# Patient Record
Sex: Female | Born: 2000 | Race: White | Hispanic: No | Marital: Single | Smoking: Never smoker
Health system: Southern US, Community
[De-identification: ages and names within clinical notes are randomized; demographics above are authoritative.]

## PROBLEM LIST (undated history)

## (undated) ENCOUNTER — Inpatient Hospital Stay (HOSPITAL_COMMUNITY): Payer: Self-pay

## (undated) DIAGNOSIS — Z789 Other specified health status: Secondary | ICD-10-CM

## (undated) HISTORY — PX: CLEFT PALATE REPAIR: SUR1165

## (undated) HISTORY — DX: Other specified health status: Z78.9

---
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
## 2018-05-26 DIAGNOSIS — Q379 Unspecified cleft palate with unilateral cleft lip: Secondary | ICD-10-CM | POA: Insufficient documentation

## 2018-05-26 HISTORY — DX: Unspecified cleft palate with unilateral cleft lip: Q37.9

## 2018-10-10 IMAGING — DX DG ANKLE COMPLETE 3+V*R*
3 series · 3 of 3 positions shown · non-contrast
Comparison: None.

CLINICAL DATA: Fall down stairs.  Ankle pain.

EXAM:
RIGHT ANKLE - COMPLETE 3+ VIEW

[ankle ap]
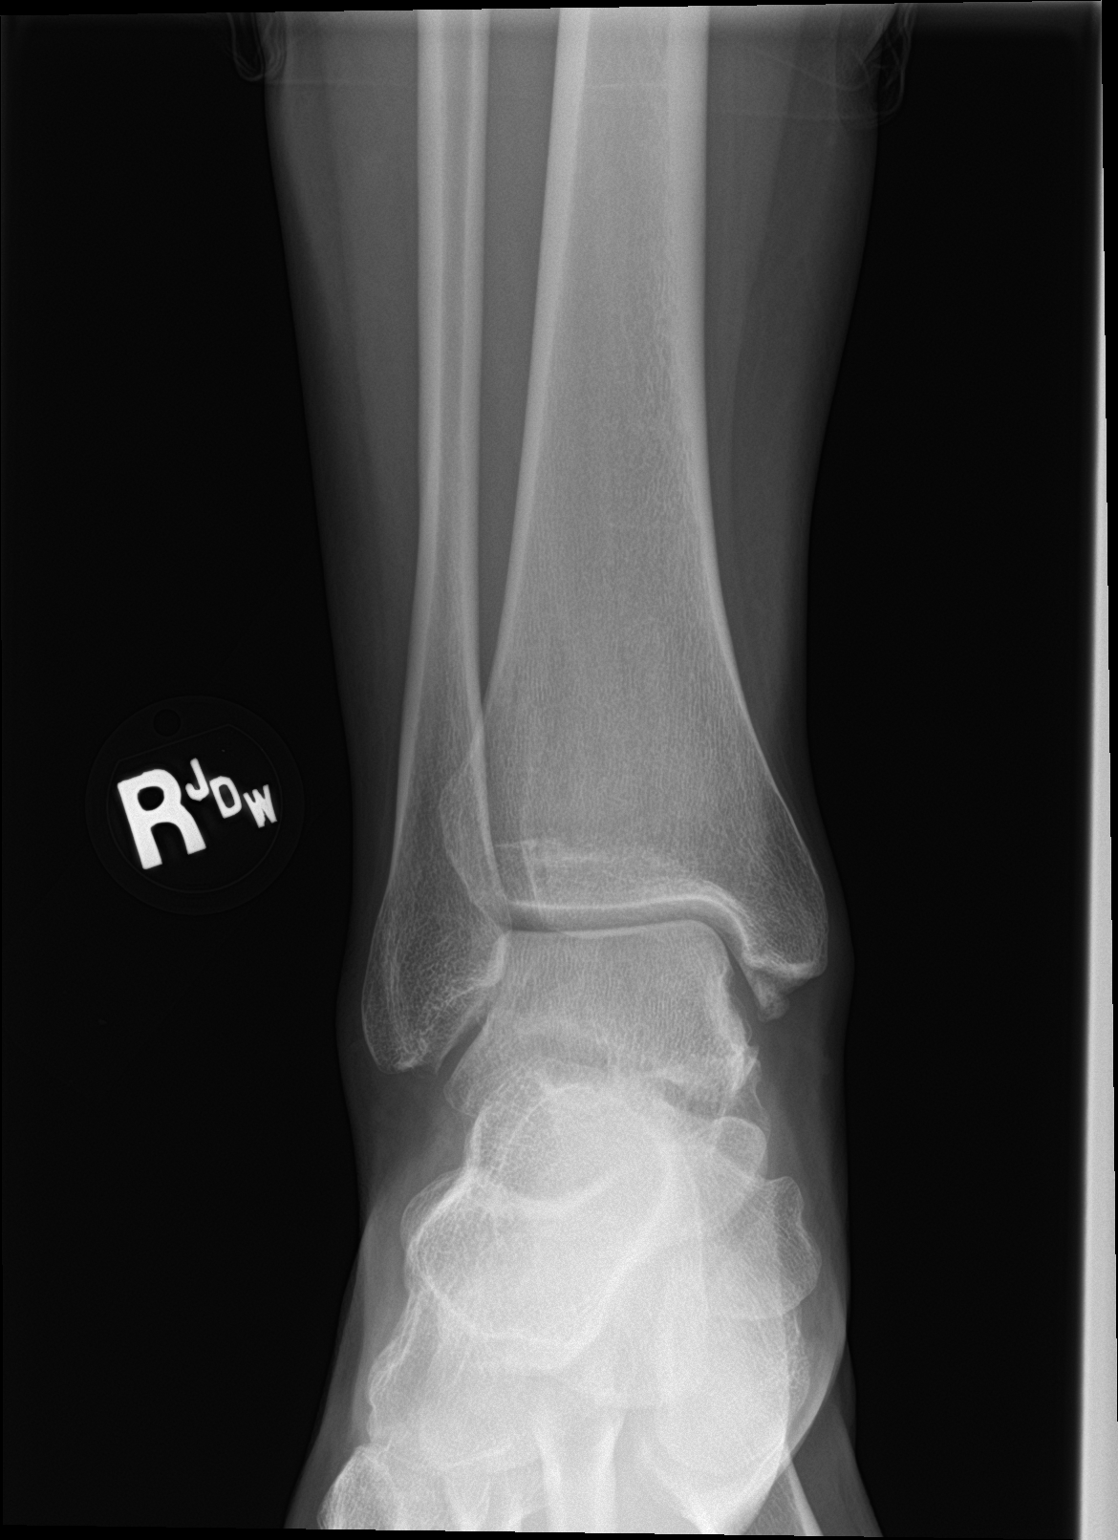

[ankle obl]
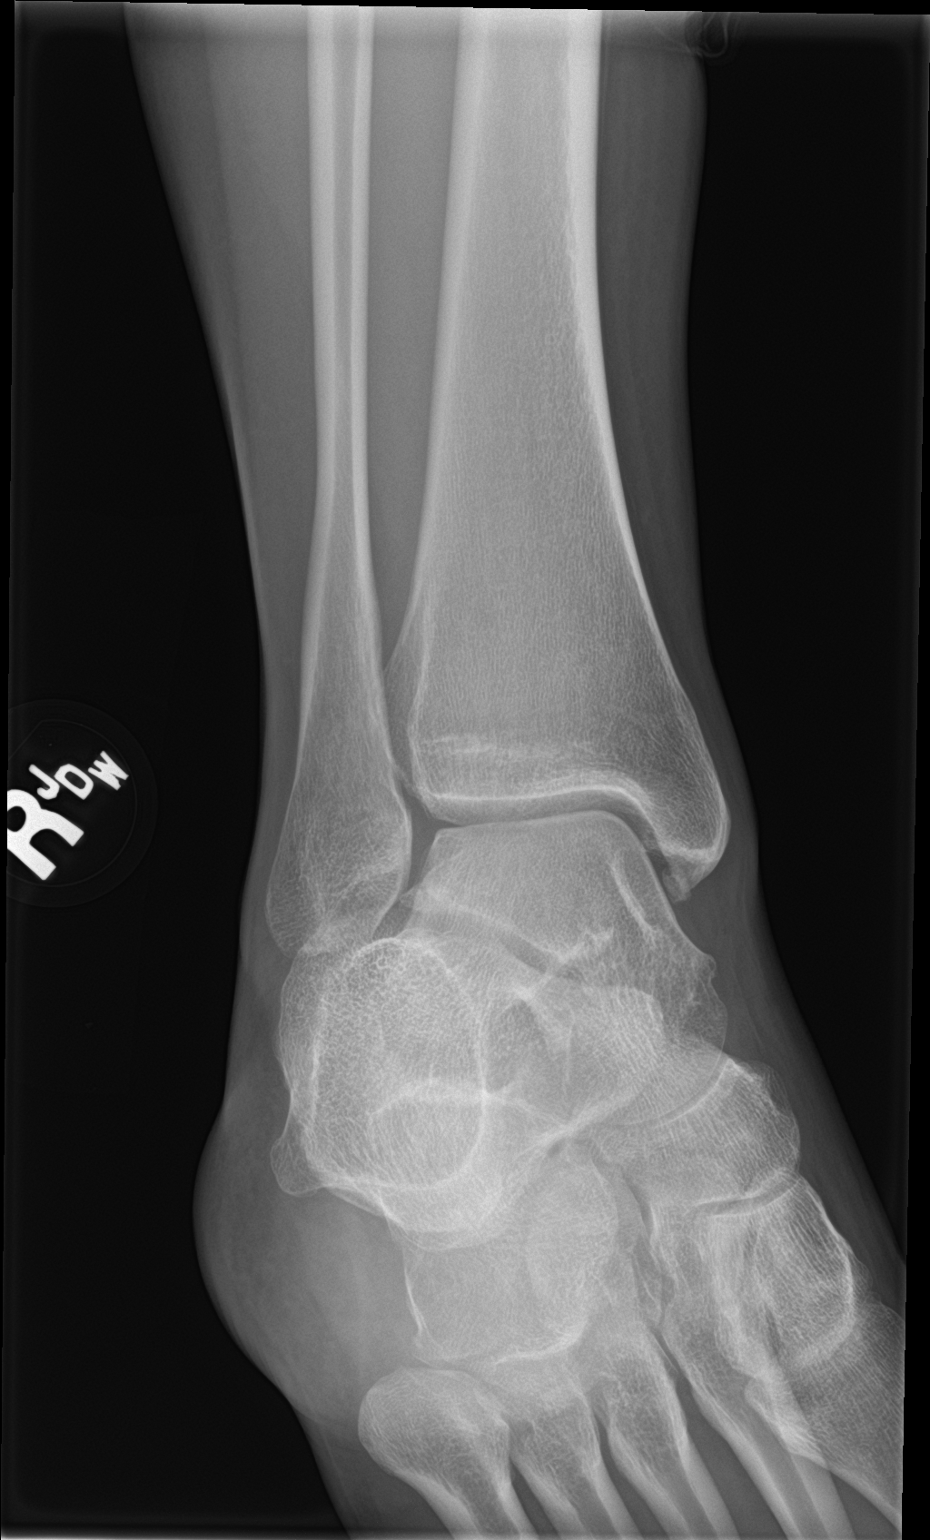

[ankle lat]
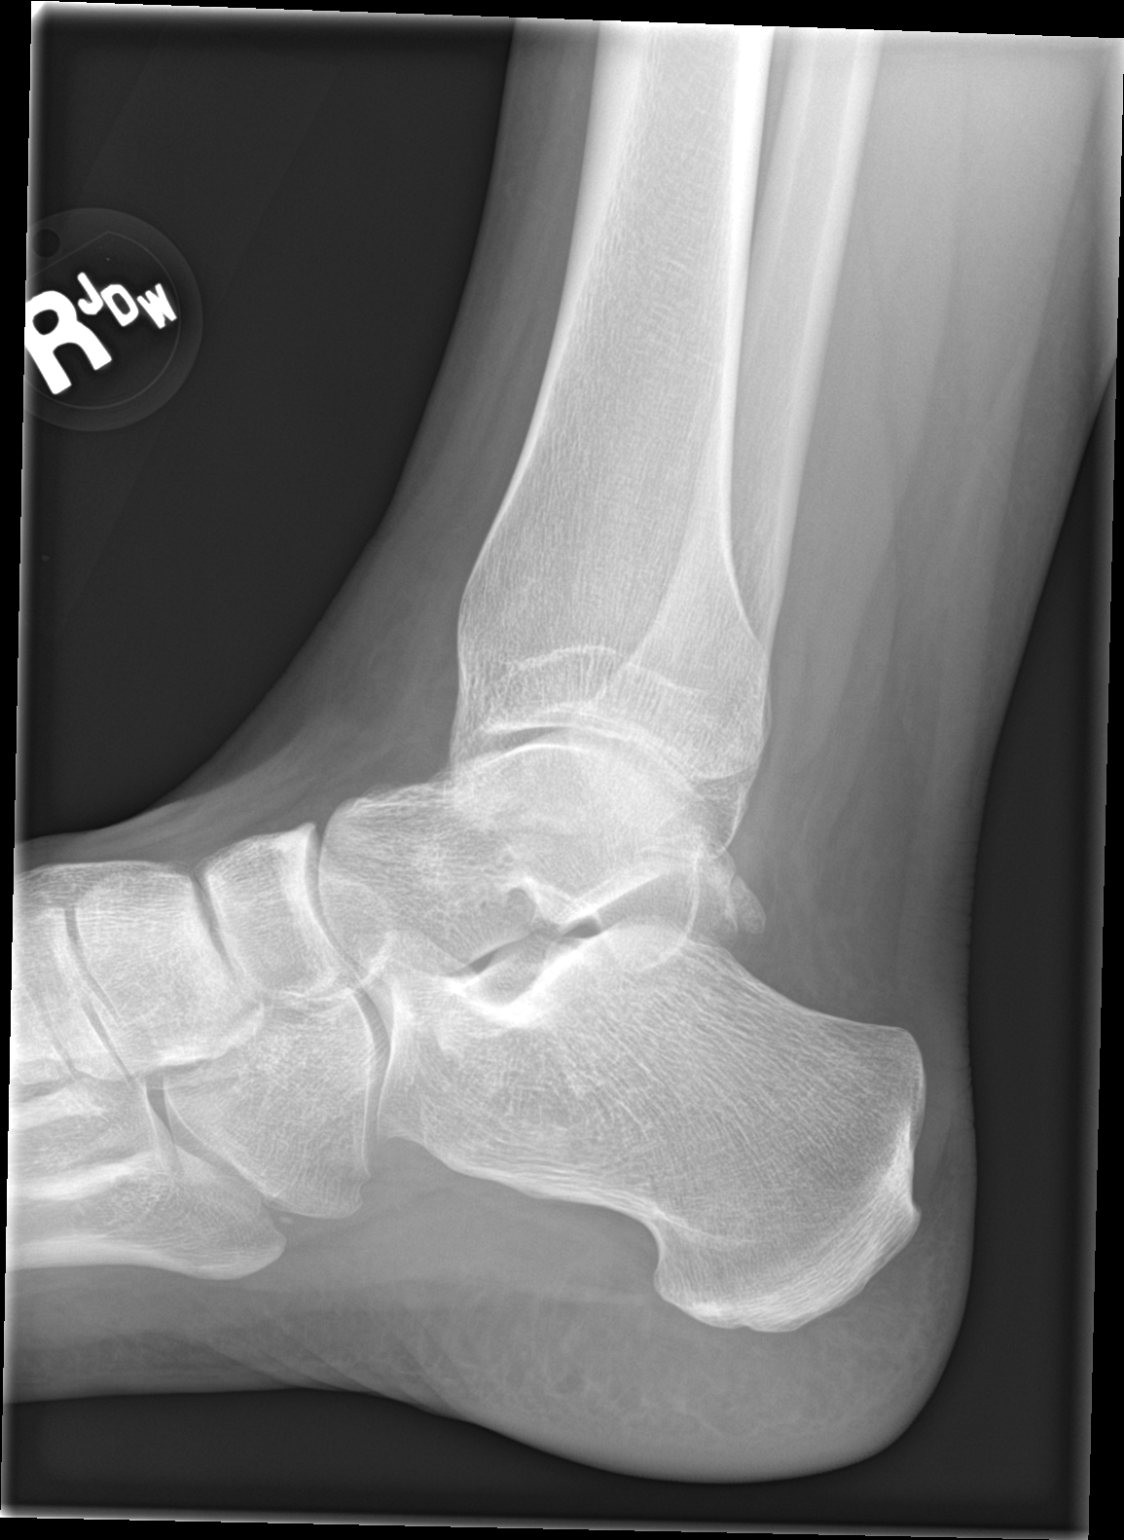

[3 of 3 positions shown; findings below may reference images not displayed]

FINDINGS: Small osseous fragment at the inferior aspect of the medial
malleolus appears corticated. No donor site is identified. There is
mild soft tissue swelling. No other evidence of fracture or
dislocation of the right ankle.
IMPRESSION: Small osseous fragment at the inferior aspect of the medial
malleolus is favored to be an accessory ossicle or a sequela of
prior trauma. An acute avulsion fracture is less likely.

## 2020-11-01 ENCOUNTER — Emergency Department (HOSPITAL_COMMUNITY)
Admission: EM | Admit: 2020-11-01 | Discharge: 2020-11-01 | Disposition: A | Discharge status: Home / Self Care | Payer: Medicaid Other | Attending: Student | Admitting: Student

## 2020-11-01 ENCOUNTER — Other Ambulatory Visit: Payer: Self-pay

## 2020-11-01 ENCOUNTER — Encounter (HOSPITAL_COMMUNITY): Payer: Self-pay | Admitting: Emergency Medicine

## 2020-11-01 DIAGNOSIS — R059 Cough, unspecified: Secondary | ICD-10-CM | POA: Diagnosis present

## 2020-11-01 DIAGNOSIS — Z20822 Contact with and (suspected) exposure to covid-19: Secondary | ICD-10-CM | POA: Diagnosis not present

## 2020-11-01 DIAGNOSIS — R439 Unspecified disturbances of smell and taste: Secondary | ICD-10-CM | POA: Diagnosis not present

## 2020-11-01 DIAGNOSIS — Z5321 Procedure and treatment not carried out due to patient leaving prior to being seen by health care provider: Secondary | ICD-10-CM | POA: Diagnosis not present

## 2020-11-01 DIAGNOSIS — J029 Acute pharyngitis, unspecified: Secondary | ICD-10-CM | POA: Insufficient documentation

## 2020-11-01 NOTE — ED Notes (Signed)
Pt left. 
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
## 2022-12-04 ENCOUNTER — Encounter: Payer: Self-pay | Admitting: Obstetrics and Gynecology

## 2022-12-04 ENCOUNTER — Ambulatory Visit (INDEPENDENT_AMBULATORY_CARE_PROVIDER_SITE_OTHER): Payer: Medicaid Other | Admitting: Obstetrics and Gynecology

## 2022-12-04 VITALS — BP 116/82 | HR 89 | Ht 67.0 in | Wt 182.0 lb

## 2022-12-04 DIAGNOSIS — Z3046 Encounter for surveillance of implantable subdermal contraceptive: Secondary | ICD-10-CM

## 2022-12-04 NOTE — Progress Notes (Signed)
     GYNECOLOGY CLINIC PROCEDURE NOTE  Jennifer Marshall is a 22 y.o. G1P1001 here for Nexplanon removal.   Nexplanon Removal Patient identified, informed consent performed, consent signed.   Appropriate time out taken. Nexplanon site identified.  Area prepped in usual sterile fashon. One ml of 1% lidocaine was used to anesthetize the area at the distal end of the implant. A small stab incision was made right beside the implant on the distal portion.  The Nexplanon rod was grasped using hemostats and removed without difficulty.  There was minimal blood loss. There were no complications.  3 ml of 1% lidocaine was injected around the incision for post-procedure analgesia.  Steri-strips were applied over the small incision.  A pressure bandage was applied to reduce any bruising.  The patient tolerated the procedure well and was given post procedure instructions.  Patient is planning to attempt conception.  She will return in 2-4 weeks for yearly GYN exam.  Arlina Robes, MD, [facility] Attending [place] for [facility], [place]

## 2022-12-09 ENCOUNTER — Encounter: Payer: Self-pay | Admitting: Obstetrics and Gynecology

## 2022-12-18 ENCOUNTER — Ambulatory Visit: Payer: Medicaid Other | Admitting: Obstetrics and Gynecology

## 2023-01-09 ENCOUNTER — Ambulatory Visit: Payer: Medicaid Other | Admitting: Obstetrics & Gynecology

## 2023-01-09 ENCOUNTER — Ambulatory Visit: Payer: Medicaid Other | Admitting: Advanced Practice Midwife

## 2023-10-21 NOTE — L&D Delivery Note (Signed)
 Labor Progress Jennifer  Lanice Marshall is a 23 y.o. female [phone] with IUP at [redacted]w[redacted]d admitted for elective IOL. She progressed with augmentation to complete and pushed less than 20 minutes to deliver.  Cord clamping delayed by 1-3 minutes then clamped by CNM and cut by pt family member.  Placenta intact and spontaneous, bleeding minimal.  Intact perineum, no repair.  Mom and baby stable prior to transfer to postpartum. She plans on breastfeeding. She requests Depo Provera for birth control.   Delivery Note At 8:29 PM a viable and healthy female was delivered via Vaginal, Spontaneous (Presentation: Right Occiput Anterior).  APGAR: 7, 9; weight 7 lb 14 oz (3572 g).   Placenta status: Pathology, Intact.  Cord: 3 vessels with the following complications: None.   Anesthesia: Epidural Episiotomy: None Lacerations: Labial, superficial, hemostatic Suture Repair: n/a Est. Blood Loss (mL): 311  Mom to postpartum.  Baby to Couplet care / Skin to Skin.  [name] 08/10/2024, 7:08 AM

## 2023-11-07 DIAGNOSIS — R399 Unspecified symptoms and signs involving the genitourinary system: Secondary | ICD-10-CM

## 2023-11-07 NOTE — Progress Notes (Signed)
Ms orren signed on and off at several different times outside of her scheduled appointment times. I was unable to obtain a video visit with her

## 2023-12-08 ENCOUNTER — Other Ambulatory Visit: Payer: Medicaid Other

## 2023-12-09 ENCOUNTER — Ambulatory Visit
Admission: EM | Admit: 2023-12-09 | Discharge: 2023-12-09 | Disposition: A | Discharge status: Home / Self Care | Payer: Medicaid Other | Attending: Nurse Practitioner | Admitting: Nurse Practitioner

## 2023-12-09 DIAGNOSIS — R399 Unspecified symptoms and signs involving the genitourinary system: Secondary | ICD-10-CM | POA: Insufficient documentation

## 2023-12-09 DIAGNOSIS — Z3201 Encounter for pregnancy test, result positive: Secondary | ICD-10-CM | POA: Diagnosis present

## 2023-12-09 LAB — POCT URINE PREGNANCY: Preg Test, Ur: POSITIVE — AB

## 2023-12-09 LAB — POCT URINALYSIS DIP (MANUAL ENTRY)
Bilirubin, UA: NEGATIVE
Blood, UA: NEGATIVE
Glucose, UA: NEGATIVE mg/dL
Ketones, POC UA: NEGATIVE mg/dL
Nitrite, UA: NEGATIVE
Protein Ur, POC: NEGATIVE mg/dL
Spec Grav, UA: 1.015 (ref 1.010–1.025)
Urobilinogen, UA: 0.2 U/dL
pH, UA: 6 (ref 5.0–8.0)

## 2023-12-09 NOTE — ED Provider Notes (Signed)
[facility]    CSN: [id number] Arrival date & time: 12/09/23  1745      History   Chief Complaint No chief complaint on file.   HPI Jennifer Marshall is a 23 y.o. female.   The history is provided by the patient.   Patient presents for complaints of burning with urination, and a foul-smelling odor to her urine for the past week.  Denies fever, chills, hematuria, flank pain, low back pain, decreased urine stream, or vaginal symptoms.  Patient denies history of recurrent UTI.  Patient also presents requesting a pregnancy test.  Patient states that she took a pregnancy test at home which was positive, states that she would like another one for confirmation.  LMP 11/04/2023. History reviewed. No pertinent past medical history.  Patient Active Problem List   Diagnosis Date Noted   Implanon removal 12/04/2022    Past Surgical History:  Procedure Laterality Date   CLEFT PALATE REPAIR      OB History     Gravida  1   Para  1   Term  1   Preterm      AB      Living  1      SAB      IAB      Ectopic      Multiple      Live Births  1            Home Medications    Prior to Admission medications   Not on File    Family History History reviewed. No pertinent family history.  Social History Social History   Tobacco Use   Smoking status: Every Day    Current packs/day: 1.00    Types: Cigarettes   Smokeless tobacco: Never  Vaping Use   Vaping status: Some Days  Substance Use Topics   Alcohol use: No   Drug use: No     Allergies   Patient has no known allergies.   Review of Systems Review of Systems Per HPI  Physical Exam Triage Vital Signs ED Triage Vitals  Encounter Vitals Group     BP 12/09/23 1758 121/84     Systolic BP Percentile --      Diastolic BP Percentile --      Pulse Rate 12/09/23 1758 (!) 106     Resp 12/09/23 1758 18     Temp 12/09/23 1758 98.5 F (36.9 C)     Temp Source 12/09/23 1758 Oral      SpO2 12/09/23 1758 99 %     Weight --      Height --      Head Circumference --      Peak Flow --      Pain Score 12/09/23 1802 0     Pain Loc --      Pain Education --      Exclude from Growth Chart --    No data found.  Updated Vital Signs BP 121/84 (BP Location: Right Arm)   Pulse (!) 106   Temp 98.5 F (36.9 C) (Oral)   Resp 18   LMP 11/04/2023 (Within Days)   SpO2 99%   Visual Acuity Right Eye Distance:   Left Eye Distance:   Bilateral Distance:    Right Eye Near:   Left Eye Near:    Bilateral Near:     Physical Exam Vitals and nursing note reviewed.  Constitutional:      General: She is not in  acute distress.    Appearance: Normal appearance.  HENT:     Head: Normocephalic.  Eyes:     Extraocular Movements: Extraocular movements intact.     Pupils: Pupils are equal, round, and reactive to light.  Cardiovascular:     Rate and Rhythm: Normal rate and regular rhythm.     Pulses: Normal pulses.     Heart sounds: Normal heart sounds.  Pulmonary:     Effort: Pulmonary effort is normal.     Breath sounds: Normal breath sounds.  Abdominal:     General: Bowel sounds are normal.     Palpations: Abdomen is soft.     Tenderness: There is no abdominal tenderness.  Musculoskeletal:     Cervical back: Normal range of motion.  Skin:    General: Skin is warm and dry.  Neurological:     General: No focal deficit present.     Mental Status: She is alert and oriented to person, place, and time.  Psychiatric:        Mood and Affect: Mood normal.        Behavior: Behavior normal.      UC Treatments / Results  Labs (all labs ordered are listed, but only abnormal results are displayed) Labs Reviewed  URINE CULTURE - Abnormal; Notable for the following components:      Result Value   Culture   (*)    Value: 10,000 COLONIES/mL GRAM NEGATIVE RODS IDENTIFICATION AND SUSCEPTIBILITIES TO FOLLOW Performed at [facility], [address] [place],  [place] [address]    All other components within normal limits  POCT URINE PREGNANCY - Abnormal; Notable for the following components:   Preg Test, Ur Positive (*)    All other components within normal limits  POCT URINALYSIS DIP (MANUAL ENTRY) - Abnormal; Notable for the following components:   Leukocytes, UA Trace (*)    All other components within normal limits    EKG   Radiology No results found.  Procedures Procedures (including critical care time)  Medications Ordered in UC Medications - No data to display  Initial Impression / Assessment and Plan / UC Course  I have reviewed the triage vital signs and the nursing notes.  Pertinent labs & imaging results that were available during my care of the patient were reviewed by me and considered in my medical decision making (see chart for details).  Urine pregnancy test was positive.  Urinalysis with trace leukocytes.  Urine culture is pending.  Patient was advised that she will be contacted if the culture is positive to discuss treatment.  Supportive care recommendations were provided and discussed with the patient to include fluids, rest, developing a toileting schedule, and avoiding caffeine.  Discussed indications regarding follow-up.  Patient was in agreement with this plan of care and verbalized understanding.  All questions were answered.  Patient stable for discharge.  Final Clinical Impressions(s) / UC Diagnoses   Final diagnoses:  UTI symptoms  Positive pregnancy test     Discharge Instructions      Your urine pregnancy test was positive. A urine culture has been ordered.  You will be contacted if the pending test result is abnormal.  You also have access to your results via MyChart. Increased your fluid intake.  Try to drink at least 8-10 8 ounce glasses of water daily. Try to develop a schedule that will allow you to urinate every 2 hours. If symptoms appear to be worsening while your culture is pending,  you may  follow-up in this clinic or with your primary care physician for further evaluation. Follow-up as needed.     ED Prescriptions   None    PDMP not reviewed this encounter.   Abran Cantor, NP 12/10/23 1939

## 2023-12-09 NOTE — Discharge Instructions (Addendum)
Your urine pregnancy test was positive. A urine culture has been ordered.  You will be contacted if the pending test result is abnormal.  You also have access to your results via MyChart. Increased your fluid intake.  Try to drink at least 8-10 8 ounce glasses of water daily. Try to develop a schedule that will allow you to urinate every 2 hours. If symptoms appear to be worsening while your culture is pending, you may follow-up in this clinic or with your primary care physician for further evaluation. Follow-up as needed.

## 2023-12-09 NOTE — ED Triage Notes (Addendum)
Pt reports burning with urination, strong odor to urine x 1 week denies abdominal pain and pressure.    Pt states she took an at hone pregnancy test and it was positive would like confirmation.

## 2023-12-11 ENCOUNTER — Telehealth (HOSPITAL_COMMUNITY): Payer: Self-pay

## 2023-12-11 LAB — URINE CULTURE: Culture: 10000 — AB

## 2023-12-11 MED ORDER — CEFDINIR 300 MG PO CAPS: 300.0000 mg | ORAL_CAPSULE | Freq: Two times a day (BID) | ORAL | 0 refills | Status: AC

## 2023-12-11 NOTE — Telephone Encounter (Signed)
-----   Message from Chandler P Hermanns sent at 12/11/2023 11:44 AM EST ----- Cefdinir 300mg  BID X 7 days ----- Message ----- From: Warren Danes, RN Sent: 12/11/2023  11:19 AM EST To: Abran Cantor, NP; #  Please advise regarding treatment for pt's urine culture. Thank you

## 2023-12-11 NOTE — Telephone Encounter (Signed)
 Reviewed with patient, verified pharmacy, prescription sent

## 2023-12-17 ENCOUNTER — Other Ambulatory Visit: Payer: Self-pay

## 2023-12-17 ENCOUNTER — Encounter: Payer: Self-pay | Admitting: General Practice

## 2023-12-17 ENCOUNTER — Ambulatory Visit: Payer: Medicaid Other | Admitting: General Practice

## 2023-12-17 VITALS — BP 124/75 | HR 112 | Ht 67.0 in | Wt 175.0 lb

## 2023-12-17 DIAGNOSIS — Z3A01 Less than 8 weeks gestation of pregnancy: Secondary | ICD-10-CM

## 2023-12-17 DIAGNOSIS — Z3491 Encounter for supervision of normal pregnancy, unspecified, first trimester: Secondary | ICD-10-CM

## 2023-12-17 MED ORDER — PROMETHAZINE HCL 25 MG PO TABS: 25.0000 mg | ORAL_TABLET | Freq: Four times a day (QID) | ORAL | 0 refills | Status: DC | PRN

## 2023-12-17 MED ORDER — VITAFOL ULTRA 29-0.6-0.4-200 MG PO CAPS: 1.0000 | ORAL_CAPSULE | Freq: Every day | ORAL | 11 refills | Status: AC

## 2023-12-17 NOTE — Progress Notes (Signed)
 Patient presents to office today for new pregnancy. LMP Approximately 11/04/23, EDD around 08/10/24. Her first positive home test was around 2 weeks ago. Has had some nausea & would like Rx. Rx for Phenergan sent to pharmacy per protocol. Scheduled patient for ultrasound 3/6 to confirm dating/viability. Patient will return for new OB intake & new OB appt with [facility].   Chase Caller RN BSN 12/17/23

## 2023-12-24 ENCOUNTER — Other Ambulatory Visit: Payer: Medicaid Other

## 2023-12-24 ENCOUNTER — Other Ambulatory Visit

## 2024-01-11 ENCOUNTER — Other Ambulatory Visit

## 2024-01-13 ENCOUNTER — Other Ambulatory Visit

## 2024-01-14 ENCOUNTER — Telehealth: Payer: Medicaid Other

## 2024-01-19 ENCOUNTER — Other Ambulatory Visit: Payer: Self-pay

## 2024-01-19 ENCOUNTER — Encounter: Payer: Self-pay | Admitting: Family Medicine

## 2024-01-19 ENCOUNTER — Ambulatory Visit (INDEPENDENT_AMBULATORY_CARE_PROVIDER_SITE_OTHER): Payer: Self-pay | Admitting: Family Medicine

## 2024-01-19 ENCOUNTER — Ambulatory Visit

## 2024-01-19 ENCOUNTER — Other Ambulatory Visit (HOSPITAL_COMMUNITY)
Admission: RE | Admit: 2024-01-19 | Discharge: 2024-01-19 | Disposition: A | Discharge status: Home / Self Care | Source: Ambulatory Visit | Attending: Family Medicine | Admitting: Family Medicine

## 2024-01-19 VITALS — BP 107/71 | HR 80 | Wt 194.8 lb

## 2024-01-19 DIAGNOSIS — F1191 Opioid use, unspecified, in remission: Secondary | ICD-10-CM | POA: Diagnosis not present

## 2024-01-19 DIAGNOSIS — O0990 Supervision of high risk pregnancy, unspecified, unspecified trimester: Secondary | ICD-10-CM

## 2024-01-19 DIAGNOSIS — Z1331 Encounter for screening for depression: Secondary | ICD-10-CM | POA: Diagnosis not present

## 2024-01-19 DIAGNOSIS — Z3A10 10 weeks gestation of pregnancy: Secondary | ICD-10-CM | POA: Diagnosis not present

## 2024-01-19 DIAGNOSIS — Z3A01 Less than 8 weeks gestation of pregnancy: Secondary | ICD-10-CM

## 2024-01-19 DIAGNOSIS — O0991 Supervision of high risk pregnancy, unspecified, first trimester: Secondary | ICD-10-CM

## 2024-01-19 DIAGNOSIS — Z3491 Encounter for supervision of normal pregnancy, unspecified, first trimester: Secondary | ICD-10-CM

## 2024-01-19 DIAGNOSIS — O219 Vomiting of pregnancy, unspecified: Secondary | ICD-10-CM | POA: Diagnosis not present

## 2024-01-19 DIAGNOSIS — Z3A09 9 weeks gestation of pregnancy: Secondary | ICD-10-CM | POA: Diagnosis not present

## 2024-01-19 DIAGNOSIS — Z124 Encounter for screening for malignant neoplasm of cervix: Secondary | ICD-10-CM

## 2024-01-19 HISTORY — DX: Opioid use, unspecified, in remission: F11.91

## 2024-01-19 MED ORDER — NALOXONE HCL 4 MG/0.1ML NA LIQD: NASAL | 11 refills | Status: DC

## 2024-01-19 MED ORDER — ONDANSETRON 4 MG PO TBDP: 4.0000 mg | ORAL_TABLET | Freq: Four times a day (QID) | ORAL | 5 refills | Status: DC | PRN

## 2024-01-19 NOTE — Progress Notes (Signed)
 Subjective:   Jennifer Marshall is a 23 y.o. G2P1001 at [redacted]w[redacted]d by LMP, early ultrasound being seen today for her first obstetrical visit.  Her obstetrical history is significant for  OUD in remission . Patient does intend to breast feed. Pregnancy history fully reviewed.  Patient reports history of OUD, primarily using fentanyl via inhaled and intranasal routes. Was abstinent for about 90 days and then began to have cravings in early February, started on suboxone but discontinued once she found out she was pregnant. Currently comfortable with plan for ongoing abstinence and not interested in resuming MAT.  First delivery was uncomplicated NSVD  HISTORY: OB History  Gravida Para Term Preterm AB Living  2 1 1  0 0 1  SAB IAB Ectopic Multiple Live Births  0 0 0 0 1    # Outcome Date GA Lbr Len/2nd Weight Sex Type Anes PTL Lv  2 Current           1 Term 10/19/18    F Vag-Spont  N LIV     Last pap smear: No Cervical Cancer Screening results to display. Would like to defer collection to next visit  History reviewed. No pertinent past medical history. Past Surgical History:  Procedure Laterality Date   CLEFT PALATE REPAIR     Family History  Problem Relation Age of Onset   Atrial fibrillation Mother    Bipolar disorder Mother    ADD / ADHD Brother    Social History   Tobacco Use   Smoking status: Every Day    Current packs/day: 0.25    Types: Cigarettes   Smokeless tobacco: Never  Vaping Use   Vaping status: Every Day  Substance Use Topics   Alcohol use: No   Drug use: No   No Known Allergies Current Outpatient Medications on File Prior to Visit  Medication Sig Dispense Refill   Prenat-Fe Poly-Methfol-FA-DHA (VITAFOL ULTRA) 29-0.6-0.4-200 MG CAPS Take 1 capsule by mouth daily. 30 capsule 11   No current facility-administered medications on file prior to visit.     Exam   Vitals:   01/19/24 0957  BP: 107/71  Pulse: 80  Weight: 194 lb 12.8 oz (88.4  kg)   Fetal Heart Rate (bpm): 169  System: General: well-developed, well-nourished female in no acute distress   Skin: normal coloration and turgor, no rashes   Neurologic: oriented, normal, negative, normal mood   Extremities: normal strength, tone, and muscle mass, ROM of all joints is normal   HEENT PERRLA, extraocular movement intact and sclera clear, anicteric   Neck supple and no masses   Respiratory:  no respiratory distress      Assessment:   Pregnancy: G2P1001 Patient Active Problem List   Diagnosis Date Noted   Supervision of high risk pregnancy, antepartum 01/19/2024   Opioid use disorder in remission 01/19/2024   Cleft lip and cleft palate 05/26/2018     Plan:  1. Supervision of high risk pregnancy in first trimester BP and FHR normal Initial labs drawn. Continue prenatal vitamins. Genetic Screening discussed, NIPS: ordered. Ultrasound discussed; fetal anatomic survey: ordered. Problem list reviewed and updated. The nature of [place] [facility] with multiple MDs and other Advanced Practice Providers was explained to patient; also emphasized that residents, students are part of our team.  2. Opioid use disorder in remission Patient wishes to continue with abstinence which is not unreasonable given her success We discussed MAT and specifically suboxone which she had  been on briefly Discussed that there are associated neurodevelopmental outcomes but that the evidence is poor and my opinion is that most babies do fine as long as they have a loving parent and a stable home environment If she develops cravings or relapses she can get in touch with me and we can always pursue MAT Rx sent for narcan per protocol Screening serologies for hepatitis sent UDS sent Will see back in four weeks  3. Screening for cervical cancer Defer pap to next visit    Routine obstetric precautions reviewed. Return in 4 weeks (on 02/16/2024) for REACH  clinic, ob visit.    Venora Maples, MD/MPH Attending Family Medicine Physician, [facility] for [facility], [facility]

## 2024-01-19 NOTE — Progress Notes (Signed)
   Subjective:   Jennifer Marshall is a 23 y.o. G2P1001 here today for a new OB visit and evaluation for management of OUD hx, substance exposed newborn care..  Jennifer is accompanied by her sister to the community room where the [facility] team met with them.  Dr. Crissie Reese reviewed OUD, options for management and potential risks.  Jennifer reports she had initially begun suboxone to manage OUD (fentanyl use) but stopped the suboxone when she found out she was pregnant due to concern for her baby being dependent on suboxone.  She continues abstinence as he management of OUD.    Health Maintenance Due  Topic Date Due   Pneumococcal Vaccine 66-32 Years old (1 of 2 - PCV) Never done   HPV VACCINES (1 - 3-dose series) Never done   HIV Screening  Never done   Hepatitis C Screening  Never done   DTaP/Tdap/Td (1 - Tdap) Never done   Cervical Cancer Screening (Pap smear)  Never done   COVID-19 Vaccine (1 - 2024-25 season) Never done    History reviewed. No pertinent past medical history.  Past Surgical History:  Procedure Laterality Date   CLEFT PALATE REPAIR      The following portions of the patient's history were reviewed and updated as appropriate: allergies, current medications, past family history, past medical history, past social history, past surgical history and problem list.     Objective:    Jennifer is well appearing with clear thoughts and communication.  Receptive to communication from the REACH team.      Assessment and Plan:  Praised Jennifer on her recovery and oriented her to [facility] clinic, providers and available resources.  In the event that she chooses MOUD, we discussed the extended newborn observation period following delivery, including Eat, Sleep, Console management.  Encouraged breastfeeding as able.  Gave Jennifer our substance exposed newborn pamphlet with appropriate contact information.  She knows to contact us as needs arise. Problem List Items Addressed  This Visit       Other   Supervision of high risk pregnancy, antepartum   Opioid use disorder in remission   Relevant Medications   naloxone (NARCAN) nasal spray 4 mg/0.1 mL   Other Relevant Orders   ToxAssure Flex 15, Ur   Comp Met (CMET)   Hepatitis A Ab, Total   Hepatitis B surface antibody,quantitative   Other Visit Diagnoses       Supervision of high risk pregnancy in first trimester    -  Primary   Relevant Orders   CBC/D/Plt+RPR+Rh+ABO+RubIgG...   HgB A1c   Culture, OB Urine   PANORAMA PRENATAL TEST   HORIZON Basic Panel   [place] MFM OB DETAIL +14 WK   GC/Chlamydia probe amp ([place])not at [facility]     Screening for cervical cancer         Nausea/vomiting in pregnancy       Relevant Medications   ondansetron (ZOFRAN-ODT) 4 MG disintegrating tablet       Routine preventative health maintenance measures emphasized. Please refer to After Visit Summary for other counseling recommendations.   Return in 4 weeks (on 02/16/2024) for REACH clinic, ob visit.    Total face-to-face time with patient: 15 minutes.  Over 50% of encounter was spent on counseling and coordination of care.   Hubert Azure, NNP-BC Neonatal Nurse Practitioner Substance Exposed Newborn Consult at the [facility] [phone]

## 2024-01-19 NOTE — Patient Instructions (Signed)
 Second Trimester of Pregnancy  The second trimester of pregnancy is from week 14 through week 27. This is months 4 through 6 of pregnancy. During the second trimester: Morning sickness is less or has stopped. You may have more energy. You may feel hungry more often. At this time, your unborn baby is growing very fast. At the end of the sixth month, the unborn baby may be up to 12 inches long and weigh about 1 pounds. You will likely start to feel the baby move between 16 and 20 weeks of pregnancy. Body changes during your second trimester Your body continues to change during this time. The changes usually go away after your baby is born. Physical changes You will gain more weight. Your belly will get bigger. You may begin to get stretch marks on your hips, belly, and breasts. Your breasts will keep growing and may hurt. You may get dark spots or blotches on your face. A dark line from your belly button to the pubic area may appear. This line is called linea nigra. Your hair may grow faster and get thicker. Health changes You may have headaches. You may have heartburn. You may pee more often. You may have swollen, bulging veins (varicose veins). You may have trouble pooping (constipation), or swollen veins in the butt that can itch or get painful (hemorrhoids). You may have back pain. This is caused by: Weight gain. Pregnancy hormones that are relaxing the joints in your pelvis. Follow these instructions at home: Medicines Talk to your health care provider if you're taking medicines. Ask if the medicines are safe to take during pregnancy. Your provider may change the medicines that you take. Do not take any medicines unless told to by your provider. Take a prenatal vitamin that has at least 600 micrograms (mcg) of folic acid. Do not use herbal medicines, illegal drugs, or medicines that are not approved by your provider. Eating and drinking While you're pregnant your body needs  extra food for your growing baby. Talk with your provider about what to eat while pregnant. Activity Most women are able to exercise during pregnancy. Exercises may need to change as your pregnancy goes on. Talk to your provider about your activities and exercise routines. Relieving pain and discomfort Wear a good, supportive bra if your breasts hurt. Rest with your legs raised if you have leg cramps or low back pain. Take warm sitz baths to soothe pain from hemorrhoids. Use hemorrhoid cream if your provider says it's okay. Do not douche. Do not use tampons or scented pads. Do not use hot tubs, steam rooms, or saunas. Safety Wear your seatbelt at all times when you're in a car. Talk to your provider if someone hits you, hurts you, or yells at you. Talk with your provider if you're feeling sad or have thoughts of hurting yourself. Lifestyle Certain things can be harmful while you're pregnant. It's best to avoid the following: Do not drink alcohol,smoke, vape, or use products with nicotine or tobacco in them. If you need help quitting, talk with your provider. Avoid cat litter boxes and soil used by cats. These things carry germs that can cause harm to your pregnancy and your baby. General instructions Keep all follow-up visits. It helps you and your unborn baby stay as healthy as possible. Write down your questions. Take them to your prenatal visits. Your provider will: Talk with you about your overall health. Give you advice or refer you to specialists who can help with different needs,  including: Prenatal education classes. Mental health and counseling. Foods and healthy eating. Ask for help if you need help with food. Where to find more information American Pregnancy Association: americanpregnancy.org [employer] of Obstetricians and Gynecologists: acog.org Office on [employer] Health: TravelLesson.ca Contact a health care provider if: You have a headache that does not go away  when you take medicine. You have any of these problems: You can't eat or drink. You throw up or feel like you may throw up. You have watery poop (diarrhea) for 2 days or more. You have pain when you pee or your pee smells bad. You have been sick for 2 days or more and are not getting better. Contact your provider right away if: You have any of these coming from your vagina: Abnormal discharge. Bad-smelling fluid. Bleeding. Your baby is moving less than usual. You have contractions, belly cramping, or have pain in your pelvis or lower back. You have symptoms of high blood pressure or preeclampsia. These include: A severe, throbbing headache that does not go away. Sudden or extreme swelling of your face, hands, legs, or feet. Vision problems: You see spots. You have blurry vision. Your eyes are sensitive to light. If you can't reach the provider, go to an urgent care or emergency room. Get help right away if: You faint, become confused, or can't think clearly. You have chest pain or trouble breathing. You have any kind of injury, such as from a fall or a car crash. These symptoms may be an emergency. Call 911 right away. Do not wait to see if the symptoms will go away. Do not drive yourself to the hospital. This information is not intended to replace advice given to you by your health care provider. Make sure you discuss any questions you have with your health care provider. Document Revised: 07/09/2023 Document Reviewed: 02/06/2023 Elsevier Patient Education  2024 [employer]. Birth Control Options Birth control is also called contraception. Birth control prevents pregnancy. There are many types of birth control. Work with your health care provider to find the best option for you. Birth control that uses hormones These types of birth control have hormones in them to prevent pregnancy. Birth control implant This is a small tube that is put into the skin of your arm. The tube can  stay in for up to 3 years. Birth control shot These are shots you get every 3 months. Birth control pills This is a pill you take every day. You need to take it at the same time each day. Birth control patch This is a patch that you put on your skin. You change it 1 time each week for 3 weeks. After that, you take the patch off for 1 week. Vaginal ring  This is a soft plastic ring that you put in your vagina. The ring is left in for 3 weeks. Then, you take it out for 1 week. Then, you put a new ring in. Barrier methods  Female condom This is a thin covering that you put on the penis before sex. The condom is thrown away after sex. Female condom This is a soft, loose covering that you put in the vagina before sex. The condom is thrown away after sex. Diaphragm A diaphragm is a soft barrier that is shaped like a bowl. It must be made to fit your body. You put it in the vagina before sex with a chemical that kills sperm called spermicide. A diaphragm should be left in the vagina for  6-8 hours after sex and taken out within 24 hours. You need to replace a diaphragm: Every 1-2 years. After giving birth. After gaining more than 15 lb (6.8 kg). If you have surgery on your pelvis. Cervical cap This is a small, soft cup that fits over the cervix. The cervix is the lowest part of the uterus. It's put in the vagina before sex, along with spermicide. The cap must be made for you. The cap should be left in for 6-8 hours after sex. It is taken out within 48 hours. A cervical cap must be prescribed and fit to your body by a provider. It should be replaced every 2 years. Sponge This is a small sponge that is put into the vagina before sex. It must be left in for at least 6 hours after sex. It must be taken out within 30 hours and thrown away. Spermicides These are chemicals that kill or stop sperm from getting into the uterus. They may be a pill, cream, jelly, or foam that you put into your vagina. They  should be used at least 10-15 minutes before sex. Intrauterine device An intrauterine device (IUD) is a device that's put in the uterus by a provider. There are two types: Hormone IUD. This kind can stay in for 3-5 years. Copper IUD. This kind can stay in for 10 years. Permanent birth control Female tubal ligation This is surgery to block the fallopian tubes. Female sterilization This is a surgery, called a vasectomy, to tie off the tubes that carry sperm in men. This method takes 3 months to work. Other forms of birth control must be used for 3 months. Natural planning methods This means not having sex on the days the female partner could get pregnant. Here are some types of natural planning birth control: Using a calendar: To keep track of the length of each menstrual cycle. To find out what days pregnancy can happen. To plan to not have sex on days when pregnancy can happen. Watching for signs of ovulation and not having sex during this time. The female partner can check for ovulation by keeping track of their temperature each day. They can also look for changes in the mucus that comes from the cervix. Where to find more information Centers for Disease Control and Prevention: TonerPromos.no. Then: Enter "birth control" in the search box. This information is not intended to replace advice given to you by your health care provider. Make sure you discuss any questions you have with your health care provider. Document Revised: 07/09/2023 Document Reviewed: 12/02/2022 Elsevier Patient Education  2024 [employer].

## 2024-01-20 ENCOUNTER — Encounter: Payer: Self-pay | Admitting: Family Medicine

## 2024-01-20 DIAGNOSIS — F1191 Opioid use, unspecified, in remission: Secondary | ICD-10-CM

## 2024-01-20 LAB — CBC/D/PLT+RPR+RH+ABO+RUBIGG...
Antibody Screen: NEGATIVE
Basophils Absolute: 0.1 10*3/uL (ref 0.0–0.2)
Basos: 1 %
EOS (ABSOLUTE): 0.2 10*3/uL (ref 0.0–0.4)
Eos: 2 %
HCV Ab: NONREACTIVE
HIV Screen 4th Generation wRfx: NONREACTIVE
Hematocrit: 38.1 % (ref 34.0–46.6)
Hemoglobin: 13 g/dL (ref 11.1–15.9)
Hepatitis B Surface Ag: NEGATIVE
Immature Grans (Abs): 0 10*3/uL (ref 0.0–0.1)
Immature Granulocytes: 0 %
Lymphocytes Absolute: 1.6 10*3/uL (ref 0.7–3.1)
Lymphs: 19 %
MCH: 30.8 pg (ref 26.6–33.0)
MCHC: 34.1 g/dL (ref 31.5–35.7)
MCV: 90 fL (ref 79–97)
Monocytes Absolute: 0.5 10*3/uL (ref 0.1–0.9)
Monocytes: 6 %
Neutrophils Absolute: 5.8 10*3/uL (ref 1.4–7.0)
Neutrophils: 72 %
Platelets: 338 10*3/uL (ref 150–450)
RBC: 4.22 x10E6/uL (ref 3.77–5.28)
RDW: 11.8 % (ref 11.7–15.4)
RPR Ser Ql: NONREACTIVE
Rh Factor: POSITIVE
Rubella Antibodies, IGG: 1.11 {index} (ref >0.99)
WBC: 8.1 10*3/uL (ref 3.4–10.8)

## 2024-01-20 LAB — COMPREHENSIVE METABOLIC PANEL WITH GFR
ALT: 15 IU/L (ref 0–32)
AST: 20 IU/L (ref 0–40)
Albumin: 4.5 g/dL (ref 4.0–5.0)
Alkaline Phosphatase: 48 IU/L (ref 44–121)
BUN/Creatinine Ratio: 16 (ref 9–23)
BUN: 11 mg/dL (ref 6–20)
Bilirubin Total: 0.3 mg/dL (ref 0.0–1.2)
CO2: 16 mmol/L — ABNORMAL LOW (ref 20–29)
Calcium: 9.7 mg/dL (ref 8.7–10.2)
Chloride: 100 mmol/L (ref 96–106)
Creatinine, Ser: 0.67 mg/dL (ref 0.57–1.00)
Globulin, Total: 2.3 g/dL (ref 1.5–4.5)
Glucose: 82 mg/dL (ref 70–99)
Potassium: 4 mmol/L (ref 3.5–5.2)
Sodium: 136 mmol/L (ref 134–144)
Total Protein: 6.8 g/dL (ref 6.0–8.5)
eGFR: 124 mL/min/{1.73_m2} (ref >59)

## 2024-01-20 LAB — HEMOGLOBIN A1C
Est. average glucose Bld gHb Est-mCnc: 100 mg/dL
Hgb A1c MFr Bld: 5.1 % (ref 4.8–5.6)

## 2024-01-20 LAB — HEPATITIS B SURFACE ANTIBODY, QUANTITATIVE: Hepatitis B Surf Ab Quant: 8.9 m[IU]/mL — ABNORMAL LOW

## 2024-01-20 LAB — HCV INTERPRETATION

## 2024-01-20 LAB — HEPATITIS A ANTIBODY, TOTAL: hep A Total Ab: POSITIVE — AB

## 2024-01-21 LAB — TOXASSURE FLEX 15, UR
6-ACETYLMORPHINE IA: NEGATIVE ng/mL
7-aminoclonazepam: 133 ng/mg{creat}
AMPHETAMINES IA: NEGATIVE ng/mL
Alpha-hydroxyalprazolam: NOT DETECTED ng/mg{creat}
Alpha-hydroxymidazolam: NOT DETECTED ng/mg{creat}
Alpha-hydroxytriazolam: NOT DETECTED ng/mg{creat}
Alprazolam: NOT DETECTED ng/mg{creat}
BARBITURATES IA: NEGATIVE ng/mL
BUPRENORPHINE: NEGATIVE
Benzodiazepines: POSITIVE
Buprenorphine: NOT DETECTED ng/mg{creat}
COCAINE METABOLITE IA: NEGATIVE ng/mL
Clonazepam: NOT DETECTED ng/mg{creat}
Creatinine: 64 mg/dL
Desalkylflurazepam: NOT DETECTED ng/mg{creat}
Desmethyldiazepam: NOT DETECTED ng/mg{creat}
Desmethylflunitrazepam: NOT DETECTED ng/mg{creat}
Diazepam: NOT DETECTED ng/mg{creat}
ETHYL ALCOHOL Enzymatic: NEGATIVE g/dL
FENTANYL: NEGATIVE
Fentanyl: NOT DETECTED ng/mg{creat}
Flunitrazepam: NOT DETECTED ng/mg{creat}
Lorazepam: NOT DETECTED ng/mg{creat}
METHADONE IA: NEGATIVE ng/mL
METHADONE MTB IA: NEGATIVE ng/mL
Midazolam: NOT DETECTED ng/mg{creat}
Norbuprenorphine: NOT DETECTED ng/mg{creat}
Norfentanyl: NOT DETECTED ng/mg{creat}
OPIATE CLASS IA: NEGATIVE ng/mL
OXYCODONE CLASS IA: NEGATIVE ng/mL
Oxazepam: NOT DETECTED ng/mg{creat}
PHENCYCLIDINE IA: NEGATIVE ng/mL
TAPENTADOL, IA: NEGATIVE ng/mL
TRAMADOL IA: NEGATIVE ng/mL
Temazepam: NOT DETECTED ng/mg{creat}

## 2024-01-21 LAB — CANNABINOIDS, MS, UR RFX
Cannabinoids Confirmation: POSITIVE
Carboxy-THC: 758 ng/mg{creat}

## 2024-01-21 MED ORDER — BUPRENORPHINE HCL-NALOXONE HCL 8-2 MG SL SUBL: 1.0000 | SUBLINGUAL_TABLET | Freq: Two times a day (BID) | SUBLINGUAL | 0 refills | Status: AC

## 2024-01-22 ENCOUNTER — Encounter: Payer: Self-pay | Admitting: Family Medicine

## 2024-01-22 DIAGNOSIS — R8271 Bacteriuria: Secondary | ICD-10-CM | POA: Insufficient documentation

## 2024-01-22 DIAGNOSIS — O99891 Other specified diseases and conditions complicating pregnancy: Secondary | ICD-10-CM | POA: Insufficient documentation

## 2024-01-22 LAB — URINE CULTURE, OB REFLEX

## 2024-01-22 LAB — GC/CHLAMYDIA PROBE AMP (~~LOC~~) NOT AT ARMC
Chlamydia: NEGATIVE
Comment: NEGATIVE
Comment: NORMAL
Neisseria Gonorrhea: NEGATIVE

## 2024-01-22 LAB — CULTURE, OB URINE

## 2024-01-22 MED ORDER — NITROFURANTOIN MONOHYD MACRO 100 MG PO CAPS: 100.0000 mg | ORAL_CAPSULE | Freq: Two times a day (BID) | ORAL | 0 refills | Status: DC

## 2024-01-24 LAB — PANORAMA PRENATAL TEST FULL PANEL:PANORAMA TEST PLUS 5 ADDITIONAL MICRODELETIONS: FETAL FRACTION: 10.1

## 2024-01-25 ENCOUNTER — Encounter: Payer: Self-pay | Admitting: *Deleted

## 2024-01-26 ENCOUNTER — Encounter: Payer: Self-pay | Admitting: Family Medicine

## 2024-01-26 LAB — HORIZON CUSTOM: REPORT SUMMARY: POSITIVE — AB

## 2024-01-27 ENCOUNTER — Telehealth: Payer: Self-pay | Admitting: *Deleted

## 2024-01-27 ENCOUNTER — Encounter: Payer: Self-pay | Admitting: Family Medicine

## 2024-01-27 DIAGNOSIS — Z141 Cystic fibrosis carrier: Secondary | ICD-10-CM | POA: Insufficient documentation

## 2024-01-27 NOTE — Telephone Encounter (Signed)
-----   Message from Venora Maples sent at 01/27/2024  9:28 AM EDT ----- Abnormal [facility] genetic screen Problem list updated Please call patient to notify her of the result and refer to [place] for genetic counseling

## 2024-01-27 NOTE — Telephone Encounter (Signed)
 I called [place] and reviewed result of Horizon and offered genetic counseling thru [facility] and partner testing for which she can get kit in our office or thru [facility]. . She voices understanding.  Nancy Fetter

## 2024-02-16 ENCOUNTER — Encounter: Admitting: Family Medicine

## 2024-03-01 ENCOUNTER — Encounter: Payer: Self-pay | Admitting: Family Medicine

## 2024-03-01 ENCOUNTER — Other Ambulatory Visit: Payer: Self-pay

## 2024-03-01 ENCOUNTER — Ambulatory Visit (INDEPENDENT_AMBULATORY_CARE_PROVIDER_SITE_OTHER): Admitting: Family Medicine

## 2024-03-01 VITALS — BP 116/79 | HR 88 | Wt 185.8 lb

## 2024-03-01 DIAGNOSIS — Z789 Other specified health status: Secondary | ICD-10-CM | POA: Insufficient documentation

## 2024-03-01 DIAGNOSIS — O99891 Other specified diseases and conditions complicating pregnancy: Secondary | ICD-10-CM

## 2024-03-01 DIAGNOSIS — O0992 Supervision of high risk pregnancy, unspecified, second trimester: Secondary | ICD-10-CM

## 2024-03-01 DIAGNOSIS — Z3A16 16 weeks gestation of pregnancy: Secondary | ICD-10-CM

## 2024-03-01 DIAGNOSIS — Z141 Cystic fibrosis carrier: Secondary | ICD-10-CM | POA: Diagnosis not present

## 2024-03-01 DIAGNOSIS — O99342 Other mental disorders complicating pregnancy, second trimester: Secondary | ICD-10-CM

## 2024-03-01 DIAGNOSIS — F319 Bipolar disorder, unspecified: Secondary | ICD-10-CM

## 2024-03-01 DIAGNOSIS — O0990 Supervision of high risk pregnancy, unspecified, unspecified trimester: Secondary | ICD-10-CM

## 2024-03-01 DIAGNOSIS — R8271 Bacteriuria: Secondary | ICD-10-CM

## 2024-03-01 DIAGNOSIS — Q379 Unspecified cleft palate with unilateral cleft lip: Secondary | ICD-10-CM

## 2024-03-01 DIAGNOSIS — F1191 Opioid use, unspecified, in remission: Secondary | ICD-10-CM

## 2024-03-01 MED ORDER — LAMICTAL STARTER 42 X 25 MG & 7 X 100 MG PO KIT: PACK | ORAL | 0 refills | Status: DC

## 2024-03-01 MED ORDER — LAMOTRIGINE 100 MG PO TABS: 100.0000 mg | ORAL_TABLET | Freq: Every day | ORAL | 1 refills | Status: AC

## 2024-03-01 MED ORDER — BUPRENORPHINE HCL-NALOXONE HCL 8-2 MG SL SUBL: 1.0000 | SUBLINGUAL_TABLET | Freq: Two times a day (BID) | SUBLINGUAL | 0 refills | Status: DC

## 2024-03-01 NOTE — Progress Notes (Signed)
 Subjective:  Jennifer Marshall is a 23 y.o. G2P1001 at [redacted]w[redacted]d being seen today for ongoing prenatal care.  She is currently monitored for the following issues for this high-risk pregnancy and has Cleft lip and cleft palate; Supervision of high risk pregnancy, antepartum; Opioid use disorder in remission; Asymptomatic bacteriuria during pregnancy; Cystic fibrosis carrier; and Nonimmune to hepatitis B virus on their problem list.  Patient reports no complaints.  Contractions: Not present. Vag. Bleeding: None.  Movement: Absent. Denies leaking of fluid.   The following portions of the patient's history were reviewed and updated as appropriate: allergies, current medications, past family history, past medical history, past social history, past surgical history and problem list. Problem list updated.  Objective:   Vitals:   03/01/24 1329  BP: 116/79  Pulse: 88  Weight: 185 lb 12.8 oz (84.3 kg)    Fetal Status: Fetal Heart Rate (bpm): 152   Movement: Absent       Alert, oriented and cooperative. Patient is in no acute distress.     Skin: Skin is warm and dry. No rash noted.   Cardiovascular: Normal heart rate noted  Respiratory: Normal respiratory effort, no problems with respiration noted  Abdomen: Soft, gravid, appropriate for gestational age. Pain/Pressure: Absent     Pelvic: Vag. Bleeding: None     Cervical exam deferred        Extremities: Normal range of motion.  Edema: None  Mental Status: Normal mood and affect. Normal behavior. Normal judgment and thought content.   Urinalysis:      PDMP reviewed during this encounter.    Last UDS: Lab Results  Component Value Date   CREATIUR 64 01/19/2024     Assessment and Plan:  Pregnancy: G2P1001 at [redacted]w[redacted]d  1. Supervision of high risk pregnancy, antepartum (Primary) BP and FHR normal Recommended pap today, patient declined AFP collected - AFP, Serum, Open Spina Bifida  2. Opioid use disorder in remission At first  visit wanted to pursue abstinence Later sent a message stating she was having cravings and wishing to start suboxone  Rx sent and was filled a few days after new OB visit Today reports that she is doing well on Suboxone  8-2 BID. No withdrawal Sx.  Reviewed UDS that showed benzos, Reports using Klonopin from old Rx because she was "freaking out" earlier in pregnancy. Does not have a psychiatrist and had never done counseling and is not interested in counseling. States she was on Celexa, Lamictal and Klonopin and Adderall in the past feels like she needs to go back on meds.  - ToxAssure Flex 15, Ur  3. Asymptomatic bacteriuria during pregnancy Reports she did take antibiotics as soon as they were prescribed.  TOC today. - Culture, OB Urine  4. Cystic fibrosis carrier Discussed significance, offered partner testing-accepted - Amb referral to Genetic Counseling to discuss this and pt Hx Cleft Lip and Palate   5. Cleft lip and cleft palate Low risk panorama, to discuss further with MFM if any additional testing is warranted.  6. Nonimmune to hepatitis B virus Recommend Hepatitis B vaccine series, safe to give in pregnancy. Note and locations to get vaccines given.   7. Bipolar disease during pregnancy in second trimester [facility]) - Ambulatory referral to Integrated Behavioral Health - Lamotrigine (LAMICTAL STARTER) 42 x 25 MG & 7 x 100 MG KIT; Take 25 mg by mouth daily for 14 days, THEN 50 mg daily for 14 days, THEN 100 mg daily for 7 days.  Dispense: 1  kit; Refill: 0 - lamoTRIgine (LAMICTAL) 100 MG tablet; Take 1 tablet (100 mg total) by mouth daily.  Dispense: 30 tablet; Refill: 1    Preterm labor symptoms and general obstetric precautions including but not limited to vaginal bleeding, contractions, leaking of fluid and fetal movement were reviewed in detail with the patient. Please refer to After Visit Summary for other counseling recommendations.  Return in 4 weeks (on 03/29/2024) for REACH  clinic, ob visit. Schedule [facility].  Future Appointments  Date Time Provider Department Center  03/18/2024  1:00 PM [facility] PROVIDER 1 [facility]  03/18/2024  1:30 PM [facility] US[facility]  03/29/2024  1:55 PM Teena Feast, MD [facility]     Felipe Horton, Ladamien Rammel , CNM

## 2024-03-01 NOTE — Patient Instructions (Signed)

## 2024-03-03 ENCOUNTER — Ambulatory Visit: Payer: Self-pay | Admitting: Family Medicine

## 2024-03-03 DIAGNOSIS — O0990 Supervision of high risk pregnancy, unspecified, unspecified trimester: Secondary | ICD-10-CM

## 2024-03-03 DIAGNOSIS — F1191 Opioid use, unspecified, in remission: Secondary | ICD-10-CM

## 2024-03-03 DIAGNOSIS — O99891 Other specified diseases and conditions complicating pregnancy: Secondary | ICD-10-CM

## 2024-03-03 LAB — AFP, SERUM, OPEN SPINA BIFIDA
AFP MoM: 0.81
AFP Value: 23.6 ng/mL
Gest. Age on Collection Date: 16 wk
Maternal Age At EDD: 25.5 a
OSBR Risk 1 IN: 10000
Test Results:: NEGATIVE
Weight: 185 [lb_av]

## 2024-03-03 LAB — CULTURE, OB URINE

## 2024-03-03 LAB — URINE CULTURE, OB REFLEX

## 2024-03-04 ENCOUNTER — Ambulatory Visit: Admitting: Family Medicine

## 2024-03-07 ENCOUNTER — Telehealth: Payer: Self-pay | Admitting: Family Medicine

## 2024-03-07 LAB — TOXASSURE FLEX 15, UR
6-ACETYLMORPHINE IA: NEGATIVE ng/mL
7-aminoclonazepam: NOT DETECTED ng/mg{creat}
AMPHETAMINES IA: NEGATIVE ng/mL
Alpha-hydroxyalprazolam: NOT DETECTED ng/mg{creat}
Alpha-hydroxymidazolam: NOT DETECTED ng/mg{creat}
Alpha-hydroxytriazolam: NOT DETECTED ng/mg{creat}
Alprazolam: NOT DETECTED ng/mg{creat}
BARBITURATES IA: NEGATIVE ng/mL
BUPRENORPHINE: NEGATIVE
Benzodiazepines: NEGATIVE
Buprenorphine: NOT DETECTED ng/mg{creat}
COCAINE METABOLITE IA: NEGATIVE ng/mL
Clonazepam: NOT DETECTED ng/mg{creat}
Creatinine: 55 mg/dL (ref >=20)
Desalkylflurazepam: NOT DETECTED ng/mg{creat}
Desmethyldiazepam: NOT DETECTED ng/mg{creat}
Desmethylflunitrazepam: NOT DETECTED ng/mg{creat}
Diazepam: NOT DETECTED ng/mg{creat}
ETHYL ALCOHOL Enzymatic: NEGATIVE g/dL
FENTANYL: NEGATIVE
Fentanyl: NOT DETECTED ng/mg{creat}
Flunitrazepam: NOT DETECTED ng/mg{creat}
Lorazepam: NOT DETECTED ng/mg{creat}
METHADONE IA: NEGATIVE ng/mL
METHADONE MTB IA: NEGATIVE ng/mL
Midazolam: NOT DETECTED ng/mg{creat}
Norbuprenorphine: NOT DETECTED ng/mg{creat}
Norfentanyl: NOT DETECTED ng/mg{creat}
OPIATE CLASS IA: NEGATIVE ng/mL
OXYCODONE CLASS IA: NEGATIVE ng/mL
Oxazepam: NOT DETECTED ng/mg{creat}
PHENCYCLIDINE IA: NEGATIVE ng/mL
TAPENTADOL, IA: NEGATIVE ng/mL
TRAMADOL IA: NEGATIVE ng/mL
Temazepam: NOT DETECTED ng/mg{creat}

## 2024-03-07 LAB — CANNABINOIDS, MS, UR RFX
Cannabinoids Confirmation: POSITIVE
Carboxy-THC: 100 ng/mg{creat}

## 2024-03-07 NOTE — Telephone Encounter (Signed)
 I reached out to the patient to get her scheduled with [facility] this week or next.

## 2024-03-15 ENCOUNTER — Ambulatory Visit

## 2024-03-18 ENCOUNTER — Other Ambulatory Visit

## 2024-03-22 ENCOUNTER — Telehealth: Admitting: Advanced Practice Midwife

## 2024-03-22 ENCOUNTER — Encounter: Admitting: Advanced Practice Midwife

## 2024-03-22 ENCOUNTER — Ambulatory Visit: Admitting: Family Medicine

## 2024-03-22 ENCOUNTER — Telehealth: Payer: Self-pay

## 2024-03-22 ENCOUNTER — Ambulatory Visit: Admitting: Pediatrics

## 2024-03-22 DIAGNOSIS — Z91199 Patient's noncompliance with other medical treatment and regimen due to unspecified reason: Secondary | ICD-10-CM

## 2024-03-22 NOTE — Telephone Encounter (Addendum)
 Did not keep appt today during REACH clinic. Called pt to offer virtual visit. Pt accepts appt. Instructions given.

## 2024-03-22 NOTE — Progress Notes (Signed)
 Pt did not show for in-person visit. Offered virtual visit. Agreed but did not join virtual visit.

## 2024-03-25 DIAGNOSIS — F1290 Cannabis use, unspecified, uncomplicated: Secondary | ICD-10-CM | POA: Insufficient documentation

## 2024-03-25 DIAGNOSIS — O99320 Drug use complicating pregnancy, unspecified trimester: Secondary | ICD-10-CM | POA: Insufficient documentation

## 2024-03-29 ENCOUNTER — Telehealth: Payer: Self-pay | Admitting: Clinical

## 2024-03-29 ENCOUNTER — Encounter: Admitting: Family Medicine

## 2024-03-29 NOTE — Telephone Encounter (Signed)
Attempt call regarding referral; Left HIPPA-compliant message to call back Mikle Sternberg from [facility] at  [phone] (Ashelyn Mccravy's office).    

## 2024-04-05 ENCOUNTER — Ambulatory Visit

## 2024-04-06 ENCOUNTER — Other Ambulatory Visit: Payer: Self-pay | Admitting: *Deleted

## 2024-04-06 ENCOUNTER — Other Ambulatory Visit: Payer: Self-pay | Admitting: Advanced Practice Midwife

## 2024-04-06 ENCOUNTER — Ambulatory Visit: Payer: Self-pay | Admitting: Family Medicine

## 2024-04-06 ENCOUNTER — Ambulatory Visit (HOSPITAL_BASED_OUTPATIENT_CLINIC_OR_DEPARTMENT_OTHER)

## 2024-04-06 ENCOUNTER — Ambulatory Visit

## 2024-04-06 ENCOUNTER — Ambulatory Visit: Attending: Obstetrics and Gynecology | Admitting: Maternal & Fetal Medicine

## 2024-04-06 VITALS — BP 126/68 | HR 108

## 2024-04-06 DIAGNOSIS — O0990 Supervision of high risk pregnancy, unspecified, unspecified trimester: Secondary | ICD-10-CM

## 2024-04-06 DIAGNOSIS — O99320 Drug use complicating pregnancy, unspecified trimester: Secondary | ICD-10-CM

## 2024-04-06 DIAGNOSIS — Z141 Cystic fibrosis carrier: Secondary | ICD-10-CM

## 2024-04-06 DIAGNOSIS — Z363 Encounter for antenatal screening for malformations: Secondary | ICD-10-CM | POA: Insufficient documentation

## 2024-04-06 DIAGNOSIS — Z362 Encounter for other antenatal screening follow-up: Secondary | ICD-10-CM

## 2024-04-06 DIAGNOSIS — F1290 Cannabis use, unspecified, uncomplicated: Secondary | ICD-10-CM

## 2024-04-06 DIAGNOSIS — Z3A21 21 weeks gestation of pregnancy: Secondary | ICD-10-CM | POA: Insufficient documentation

## 2024-04-06 DIAGNOSIS — O99891 Other specified diseases and conditions complicating pregnancy: Secondary | ICD-10-CM | POA: Diagnosis not present

## 2024-04-06 DIAGNOSIS — F1191 Opioid use, unspecified, in remission: Secondary | ICD-10-CM

## 2024-04-06 DIAGNOSIS — Q379 Unspecified cleft palate with unilateral cleft lip: Secondary | ICD-10-CM | POA: Diagnosis not present

## 2024-04-06 DIAGNOSIS — O0992 Supervision of high risk pregnancy, unspecified, second trimester: Secondary | ICD-10-CM

## 2024-04-06 DIAGNOSIS — O0991 Supervision of high risk pregnancy, unspecified, first trimester: Secondary | ICD-10-CM

## 2024-04-06 DIAGNOSIS — Z148 Genetic carrier of other disease: Secondary | ICD-10-CM | POA: Insufficient documentation

## 2024-04-06 DIAGNOSIS — F1190 Opioid use, unspecified, uncomplicated: Secondary | ICD-10-CM

## 2024-04-06 DIAGNOSIS — O99322 Drug use complicating pregnancy, second trimester: Secondary | ICD-10-CM

## 2024-04-06 DIAGNOSIS — Z3A22 22 weeks gestation of pregnancy: Secondary | ICD-10-CM

## 2024-04-06 NOTE — Progress Notes (Signed)
 Patient information  Patient Name: Jennifer  NORBERTA Marshall  Patient MRN:   [id number]  Referring practice: MFM Referring Provider: [facility]  Problem List   Patient Active Problem List   Diagnosis Date Noted   Marijuana use during pregnancy 03/25/2024   Nonimmune to hepatitis B virus 03/01/2024   Cystic fibrosis carrier 01/27/2024   Asymptomatic bacteriuria during pregnancy 01/22/2024   Supervision of high risk pregnancy, antepartum 01/19/2024   Opioid use disorder in remission 01/19/2024   Cleft lip and cleft palate-maternal 05/26/2018    Maternal Fetal Medicine Consult Jennifer Marshall is a 23 y.o. G2P1001 at [redacted]w[redacted]d here for ultrasound and consultation. She had Low risk of a female fetus. Carrier screening was positive for CF. Maternal serum AFPwas negative. She has no acute concerns. Today we focused on the following:   The patient is doing well today without complaint.  She is compliant with her Suboxone  and attends her prenatal substance clinic.  She is a cystic fibrosis carrier and has genetic screening after today's visit.  She has no other concerns at this time and all of her questions were answered to her satisfaction.  Due to opiate exposure during pregnancy we discussed the importance of follow-up growth ultrasounds as well as notification to the pediatric doctors about potential withdrawal   Sonographic findings Single intrauterine pregnancy at 21w 0d  Fetal cardiac activity:  Observed and appears normal. Presentation: Cephalic. The anatomic structures that were well seen appear normal without evidence of soft markers. Due to poor acoustic windows some structures remain suboptimally visualized. Fetal biometry shows the estimated fetal weight at the 56 percentile.  Amniotic fluid: Within normal limits.  MVP: 4.98 cm. Placenta: Posterior. Adnexa: No abnormality visualized. Cervical length: 3 cm.  There are limitations of prenatal ultrasound such as  the inability to detect certain abnormalities due to poor visualization. Various factors such as fetal position, gestational age and maternal body habitus may increase the difficulty in visualizing the fetal anatomy.    Recommendations - EDD should be 08/17/2024 based on  C R L 1st  (01/19/24). - Follow-up growth ultrasound in 6 weeks  Review of Systems: A review of systems was performed and was negative except per HPI   Past Obstetrical History:  OB History  Gravida Para Term Preterm AB Living  2 1 1  0 0 1  SAB IAB Ectopic Multiple Live Births  0 0 0 0 1    # Outcome Date GA Lbr Len/2nd Weight Sex Type Anes PTL Lv  2 Current           1 Term 10/19/18    F Vag-Spont  N LIV     Past Medical History:  Past Medical History:  Diagnosis Date   Medical history non-contributory      Past Surgical History:    Past Surgical History:  Procedure Laterality Date   CLEFT PALATE REPAIR       Home Medications:   Current Outpatient Medications on File Prior to Visit  Medication Sig Dispense Refill   buprenorphine -naloxone  (SUBOXONE ) 8-2 mg SUBL SL tablet Place 1 tablet under the tongue in the morning and at bedtime. 60 tablet 0   lamoTRIgine  (LAMICTAL ) 100 MG tablet Take 1 tablet (100 mg total) by mouth daily. 30 tablet 1   Prenat-Fe Poly-Methfol-FA-DHA (VITAFOL  ULTRA) 29-0.6-0.4-200 MG CAPS Take 1 capsule by mouth daily. 30 capsule 11   Lamotrigine  (LAMICTAL  STARTER) 42 x 25 MG & 7 x 100 MG KIT Take 25 mg  by mouth daily for 14 days, THEN 50 mg daily for 14 days, THEN 100 mg daily for 7 days. 1 kit 0   naloxone  (NARCAN ) nasal spray 4 mg/0.1 mL Use as needed to reverse opioid overdose 2 each 11   nitrofurantoin , macrocrystal-monohydrate, (MACROBID ) 100 MG capsule Take 1 capsule (100 mg total) by mouth 2 (two) times daily. (Patient not taking: Reported on 04/06/2024) 14 capsule 0   ondansetron  (ZOFRAN -ODT) 4 MG disintegrating tablet Take 1 tablet (4 mg total) by mouth every 6 (six) hours as  needed for nausea. (Patient not taking: Reported on 03/01/2024) 20 tablet 5   No current facility-administered medications on file prior to visit.      Allergies:   No Known Allergies   Physical Exam:   Vitals:   04/06/24 1246  BP: 126/68  Pulse: (!) 108   Sitting comfortably on the sonogram table Nonlabored breathing Normal rate and rhythm Abdomen is nontender  Thank you for the opportunity to be involved with this patient's care. Please let us  know if we can be of any further assistance.   30 minutes of time was spent reviewing the patient's chart including labs, imaging and documentation.  At least 50% of this time was spent with direct patient care discussing the diagnosis, management and prognosis of her care.  Delia  Cyntha Drew MFM, [facility]   04/06/2024  2:05 PM

## 2024-04-06 NOTE — Progress Notes (Addendum)
 [facility] at [facility] [address] Phone:  [phone]   Fax:  [phone]      In-Person Genetic Counseling Clinic Note:   I spoke with 23 y.o. Jennifer Marshall today to discuss her carrier screening results. She was referred by Jennifer Marshall, Jennifer Marshall , CNM.    Pregnancy History:    G2P1001. EGA: [redacted]w[redacted]d by US . EDD: 08/16/2024. Denies major personal health concerns. Denies bleeding, infections, and fevers in this pregnancy. Reports Suboxone  use prescribed by OB. Reports reducing smoking from 1 pack to half a pack per day. Using tobacco during pregnancy has risks such as a reduction in birth weight, premature rupture of the membranes, premature birth, and pregnancy loss. There is also an increased risk of infantile complications such as sudden infant death syndrome (SIDS), colic, asthma, and childhood obesity.   Family History:    A three-generation pedigree was created and scanned into Epic under the Media tab.  Patient has a personal history of cleft lip and palate. She was counseled on this previously at [facility]. She reports this may have been due to maternal exposures. We reviewed CL/P can be due to genetic, environmental, or multifactorial causes. There is expected to be a ~4% chance to have an affected pregnancy if the CL/P is isolated. However, recurrence risk may be up to 50% in the event of an underlying genetic condition. Jennifer Marshall  denies learning delays in herself of family members, other than ADHD in herself. She reports her mother has mental health conditions- she reports her mother does not have schizophrenia.  Patient reports FOB has a 25 yo daughter from another partnership with Rett syndrome. This is an X-linked dominant condition that primarily affects females. Most cases occur de novo with low recurrence risk. Without genetic test reports, recurrence risk is limited but is expected to be low ~1%. We are happy to revisit  recurrence risk if medical/genetic test reports become available.   Maternal ethnicity reported as White and paternal ethnicity reported as White. Denies Ashkenazi Jewish ancestry.  Family history not remarkable for consanguinity, individuals with birth defects, intellectual disability, autism spectrum disorder, multiple spontaneous abortions, still births, or unexplained neonatal death.   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 Jennifer Marshall  was not found to be a carrier for alpha thalassemia, beta-hemoglobinopathies, or spinal muscular atrophy. Please see report for details. A negative result on carrier screening reduces but does not eliminate the chance of being a carrier.  We reviewed genes, autosomal recessive inheritance of CF, and the clinical features of CF. We reviewed there would be a 25% chance the pregnancy would be affected if FOB is also found to be a carrier. If both members of a couple are known to be carriers, diagnostic testing through amniocentesis is available to determine if the pregnancy is affected. The benefits, risks, and limitations of amniocentesis including the 1 in 500 risk for miscarriage were reviewed. We also discussed that CF is included on [place] 's Newborn Screening Program. The newborn screening test utilizes trypsinogen levels rather than genetic testing to detect affected individuals whereas genetic testing can determine the specific variants, if any, that a child may have inherited. Therefore, the couple may also seek postnatal genetic counseling and testing if needed or desired.   Given these results, we discussed and offered  carrier screening for Jennifer Marshall 's reproductive partner. We reviewed the benefits and limitations of carrier screening and that it can detect most but not all  carriers. Patient was provided a International aid/development worker for FOB Jennifer Marshall.   In the meantime, we calculated the risk of CF to the pregnancy based on our current knowledge of Jennifer Marshall 's test results and FOB's ethnicity. The chance that the fetus would be affected with CF would be 1 in 244 (0.4%). Carrier screening for FOB will allow us  to provide a more accurate risk to the fetus.   Previous Testing Completed:  Low risk NIPS: Jennifer Marshall  previously completed noninvasive prenatal screening (NIPS) in this pregnancy. The result is low risk, consistent with a female fetus. This screening significantly reduces but does not eliminate the chance that the current pregnancy has Down syndrome (trisomy 19), trisomy 78, trisomy 32, common sex chromosome conditions, and 22q11.2 microdeletion syndrome. Please see report for details. There are many genetic conditions that cannot be detected by NIPS.   Negative ms-AFP screening: Jennifer Marshall  previously completed a maternal serum AFP screen in this pregnancy. The result is screen negative. Please see report for details. A negative result reduces the risk that the current pregnancy has an open neural tube defect. Closed neural tube defects and some open defects may not be detected by this screen.   Plan of Care:   Horizon saliva kit provided to patient to screen FOB for CF. Routine prenatal care. NBS for newborn for CF. Postnatal referral to pediatric genetics as clinically indicated.   Informed consent was obtained. All questions were answered.   65 minutes were spent on the date of the encounter in service to the patient including preparation, face-to-face consultation, discussion of test reports and available next steps, pedigree construction, genetic risk assessment, documentation, and care coordination.    Thank you for sharing in the care of Jennifer Marshall  with us .  Please do not hesitate to contact us  at [phone] if you have any questions.   Lauraine Bodily, MS, [facility] Certified  Genetic Counselor   Genetic counseling student involved in appointment: No.  I provided general supervision for this patient and was immediately available for any patient care concerns.

## 2024-04-07 ENCOUNTER — Ambulatory Visit: Payer: Self-pay

## 2024-04-07 MED ORDER — BUPRENORPHINE HCL-NALOXONE HCL 8-2 MG SL SUBL: 1.0000 | SUBLINGUAL_TABLET | Freq: Two times a day (BID) | SUBLINGUAL | 0 refills | Status: DC

## 2024-04-19 ENCOUNTER — Other Ambulatory Visit: Payer: Self-pay

## 2024-04-19 ENCOUNTER — Encounter: Payer: Self-pay | Admitting: Advanced Practice Midwife

## 2024-04-19 ENCOUNTER — Ambulatory Visit: Admitting: Advanced Practice Midwife

## 2024-04-19 VITALS — BP 122/82 | HR 96 | Wt 192.8 lb

## 2024-04-19 DIAGNOSIS — O0990 Supervision of high risk pregnancy, unspecified, unspecified trimester: Secondary | ICD-10-CM

## 2024-04-19 DIAGNOSIS — Z3A23 23 weeks gestation of pregnancy: Secondary | ICD-10-CM

## 2024-04-19 DIAGNOSIS — O219 Vomiting of pregnancy, unspecified: Secondary | ICD-10-CM

## 2024-04-19 DIAGNOSIS — F1191 Opioid use, unspecified, in remission: Secondary | ICD-10-CM | POA: Diagnosis not present

## 2024-04-19 DIAGNOSIS — O0992 Supervision of high risk pregnancy, unspecified, second trimester: Secondary | ICD-10-CM | POA: Diagnosis not present

## 2024-04-19 DIAGNOSIS — F319 Bipolar disorder, unspecified: Secondary | ICD-10-CM | POA: Insufficient documentation

## 2024-04-19 DIAGNOSIS — Q379 Unspecified cleft palate with unilateral cleft lip: Secondary | ICD-10-CM | POA: Diagnosis not present

## 2024-04-19 DIAGNOSIS — F3170 Bipolar disorder, currently in remission, most recent episode unspecified: Secondary | ICD-10-CM

## 2024-04-19 MED ORDER — ONDANSETRON HCL 4 MG PO TABS: 4.0000 mg | ORAL_TABLET | Freq: Four times a day (QID) | ORAL | 2 refills | Status: DC | PRN

## 2024-04-19 MED ORDER — BUPRENORPHINE HCL-NALOXONE HCL 8-2 MG SL SUBL: 1.0000 | SUBLINGUAL_TABLET | Freq: Three times a day (TID) | SUBLINGUAL | 0 refills | Status: AC

## 2024-04-19 NOTE — Progress Notes (Signed)
 Subjective:  Jennifer  Jessilynn Marshall is a 23 y.o. G2P1001 at [redacted]w[redacted]d being seen today for ongoing prenatal care.  She is currently monitored for the following issues for this high-risk pregnancy and has Cleft lip and cleft palate-maternal; Supervision of high risk pregnancy, antepartum; Opioid use disorder in remission; Asymptomatic bacteriuria during pregnancy; Cystic fibrosis carrier; Nonimmune to hepatitis B virus; Marijuana use during pregnancy; and Bipolar affective disorder (HCC) on their problem list.  Patient reports no complaints.  Contractions: Not present. Vag. Bleeding: None.  Movement: Present. Denies leaking of fluid.   Addressed lack of Buprenorphine  in last ToxAssure. Reports that's that she ran out. Previous Rx was 4/3 30 day supply and ToxAssure was 5/13. Filled R after last visit. Reports taking as prescribed and denies cravings or withdrawal Sx. Reports Bipolar Sx are well-controlled since starting Lamictal .   The following portions of the patient's history were reviewed and updated as appropriate: allergies, current medications, past family history, past medical history, past social history, past surgical history and problem list. Problem list updated.  Objective:   Vitals:   04/19/24 1602  BP: 122/82  Pulse: 96  Weight: 192 lb 12.8 oz (87.5 kg)    Fetal Status: Fetal Heart Rate (bpm): 140   Movement: Present     General:  Alert, oriented and cooperative. Patient is in no acute distress.  Skin: Skin is warm and dry. No rash noted.   Cardiovascular: Normal heart rate noted  Respiratory: Normal respiratory effort, no problems with respiration noted  Abdomen: Soft, gravid, appropriate for gestational age. Pain/Pressure: Absent     Pelvic: Vag. Bleeding: None     Cervical exam deferred        Extremities: Normal range of motion.  Edema: None  Mental Status: Normal mood and affect. Normal behavior. Normal judgment and thought content.   Urinalysis:      PDMP  reviewed during this encounter.   Last UDS: Lab Results  Component Value Date   CREATIUR 55 03/01/2024     Assessment and Plan:  Pregnancy: G2P1001 at [redacted]w[redacted]d  1. Supervision of high risk pregnancy, antepartum (Primary) - ToxAssure Flex 15, Ur  2. Opioid use disorder in remission - ToxAssure Flex 15, Ur - buprenorphine -naloxone  (SUBOXONE ) 8-2 mg SUBL SL tablet; Place 1 tablet under the tongue in the morning, at noon, and at bedtime for 15 days.  Dispense: 45 tablet; Refill: 0  3. Nausea/vomiting in pregnancy - ondansetron  (ZOFRAN ) 4 MG tablet; Take 1 tablet (4 mg total) by mouth 4 (four) times daily as needed for nausea or vomiting.  Dispense: 30 tablet; Refill: 2  4. Cleft lip and cleft palate-maternal - Met with Dentist.   5. Bipolar disorder in full remission, most recent episode unspecified type (HCC) - Stable on Lamictal . Has refill.   Preterm labor symptoms and general obstetric precautions including but not limited to vaginal bleeding, contractions, leaking of fluid and fetal movement were reviewed in detail with the patient. Please refer to After Visit Summary for other counseling recommendations.   Return in about 4 weeks (around 05/17/2024) for REACH ROB/GTT in [facility]in gwith Dr. [facility] or [facility] .   Future Appointments  Date Time Provider Department Center  05/19/2024  2:00 PM [facility] PROVIDER 1 [facility]  05/19/2024  2:15 PM [facility] US2 [facility]    Total face-to-face time with patient: 20 minutes.  Over 50% of encounter was spent on counseling and coordination of care.   Emil Klassen  [name], [place] 04/19/2024 5:08  PM [facility] for [employer], [facility]

## 2024-04-19 NOTE — Progress Notes (Unsigned)
   Subjective:   Jennifer Marshall is a 23 y.o. G2P1001 here today for ongoing substance exposed newborn consult.   Health Maintenance Due  Topic Date Due   HPV VACCINES (1 - 3-dose series) Never done   DTaP/Tdap/Td (1 - Tdap) Never done   Pneumococcal Vaccine 36-72 Years old (1 of 2 - PCV) Never done   Hepatitis B Vaccines (1 of 3 - 19+ 3-dose series) Never done   Cervical Cancer Screening (Pap smear)  Never done   COVID-19 Vaccine (1 - 2024-25 season) Never done    Past Medical History:  Diagnosis Date   Medical history non-contributory     Past Surgical History:  Procedure Laterality Date   CLEFT PALATE REPAIR      The following portions of the patient's history were reviewed and updated as appropriate: allergies, current medications, past family history, past medical history, past social history, past surgical history and problem list.     Objective:   Jennifer Marshall  is well appearing in no acute distress. She has linear thinking and clear communication.      Assessment and Plan:  Met with Jennifer Marshall  today at [facility] for ongoing substance exposed newborn consult. We discussed her ongoing prenatal care and expectations following delivery. Jennifer Marshall  is currently taking Suboxone  (2 strips per day, per patient). She stated that her symptoms are manageable, however occasionally has cravings and would like to consider maybe increasing to 3 strips per day. Encouraged Jennifer Marshall  to discuss this ALONSO Sharps, CNM.   Introduced REACH clinic phone number and encouraged Jennifer Marshall  to contact consult phone in between appointments with any further questions or concerns.     Problem List Items Addressed This Visit       Other   Supervision of high risk pregnancy, antepartum - Primary   Relevant Orders   ToxAssure Flex 15, Ur   Opioid use disorder in remission   Relevant Orders   ToxAssure Flex 15, Ur    Routine preventative health maintenance measures emphasized. Please refer to  After Visit Summary for other counseling recommendations.   No follow-ups on file.    Total face-to-face time with patient: 30 minutes.  Over 50% of encounter was spent on counseling and coordination of care.   Comer Fang, NNP-BC Neonatal Nurse Practitioner Substance Exposed Newborn Consult at the [facility] [phone]

## 2024-04-19 NOTE — Patient Instructions (Signed)
 TDaP Vaccine Pregnancy Get the Whooping Cough Vaccine While You Are Pregnant (CDC)  It is important for women to get the whooping cough vaccine in the third trimester of each pregnancy. Vaccines are the best way to prevent this disease. There are 2 different whooping cough vaccines. Both vaccines combine protection against whooping cough, tetanus and diphtheria, but they are for different age groups: Tdap: for everyone 11 years or older, including pregnant women  DTaP: for children 2 months through 10 years of age  You need the whooping cough vaccine during each of your pregnancies The recommended time to get the shot is during your 27th through 36th week of pregnancy, preferably during the earlier part of this time period. The Centers for Disease Control and Prevention (CDC) recommends that pregnant women receive the whooping cough vaccine for adolescents and adults (called Tdap vaccine) during the third trimester of each pregnancy. The recommended time to get the shot is during your 27th through 36th week of pregnancy, preferably during the earlier part of this time period. This replaces the original recommendation that pregnant women get the vaccine only if they had not previously received it. The [employer] of Obstetricians and Gynecologists and the [employer] support this recommendation.  You should get the whooping cough vaccine while pregnant to pass protection to your baby frame support disabled and/or not supported in this browser  Learn why Jennifer Marshall decided to get the whooping cough vaccine in her 3rd trimester of pregnancy and how her baby girl was born with some protection against the disease. Also available on YouTube. After receiving the whooping cough vaccine, your body will create protective antibodies (proteins produced by the body to fight off diseases) and pass some of them to your baby before birth. These antibodies provide your baby some short-term  protection against whooping cough in early life. These antibodies can also protect your baby from some of the more serious complications that come along with whooping cough. Your protective antibodies are at their highest about 2 weeks after getting the vaccine, but it takes time to pass them to your baby. So the preferred time to get the whooping cough vaccine is early in your third trimester. The amount of whooping cough antibodies in your body decreases over time. That is why CDC recommends you get a whooping cough vaccine during each pregnancy. Doing so allows each of your babies to get the greatest number of protective antibodies from you. This means each of your babies will get the best protection possible against this disease.  Getting the whooping cough vaccine while pregnant is better than getting the vaccine after you give birth Whooping cough vaccination during pregnancy is ideal so your baby will have short-term protection as soon as he is born. This early protection is important because your baby will not start getting his whooping cough vaccines until he is 2 months old. These first few months of life are when your baby is at greatest risk for catching whooping cough. This is also when he's at greatest risk for having severe, potentially life-threating complications from the infection. To avoid that gap in protection, it is best to get a whooping cough vaccine during pregnancy. You will then pass protection to your baby before he is born. To continue protecting your baby, he should get whooping cough vaccines starting at 2 months old. You may never have gotten the Tdap vaccine before and did not get it during this pregnancy. If so, you should make sure  to get the vaccine immediately after you give birth, before leaving the hospital or birthing center. It will take about 2 weeks before your body develops protection (antibodies) in response to the vaccine. Once you have protection from the vaccine,  you are less likely to give whooping cough to your newborn while caring for him. But remember, your baby will still be at risk for catching whooping cough from others. A recent study looked to see how effective Tdap was at preventing whooping cough in babies whose mothers got the vaccine while pregnant or in the hospital after giving birth. The study found that getting Tdap between 27 through 36 weeks of pregnancy is 85% more effective at preventing whooping cough in babies younger than 2 months old. Blood tests cannot tell if you need a whooping cough vaccine There are no blood tests that can tell you if you have enough antibodies in your body to protect yourself or your baby against whooping cough. Even if you have been sick with whooping cough in the past or previously received the vaccine, you still should get the vaccine during each pregnancy. Breastfeeding may pass some protective antibodies onto your baby By breastfeeding, you may pass some antibodies you have made in response to the vaccine to your baby. When you get a whooping cough vaccine during your pregnancy, you will have antibodies in your breast milk that you can share with your baby as soon as your milk comes in. However, your baby will not get protective antibodies immediately if you wait to get the whooping cough vaccine until after delivering your baby. This is because it takes about 2 weeks for your body to create antibodies. Learn more about the health benefits of breastfeeding.

## 2024-04-23 LAB — TOXASSURE FLEX 15, UR
6-ACETYLMORPHINE IA: NEGATIVE ng/mL
7-aminoclonazepam: NOT DETECTED ng/mg{creat}
AMPHETAMINES IA: NEGATIVE ng/mL
Alpha-hydroxyalprazolam: NOT DETECTED ng/mg{creat}
Alpha-hydroxymidazolam: NOT DETECTED ng/mg{creat}
Alpha-hydroxytriazolam: NOT DETECTED ng/mg{creat}
Alprazolam: NOT DETECTED ng/mg{creat}
BARBITURATES IA: NEGATIVE ng/mL
BUPRENORPHINE: POSITIVE
Benzodiazepines: NEGATIVE
Buprenorphine: >637 ng/mg{creat}
COCAINE METABOLITE IA: NEGATIVE ng/mL
Clonazepam: NOT DETECTED ng/mg{creat}
Creatinine: 157 mg/dL (ref >=20)
Desalkylflurazepam: NOT DETECTED ng/mg{creat}
Desmethyldiazepam: NOT DETECTED ng/mg{creat}
Desmethylflunitrazepam: NOT DETECTED ng/mg{creat}
Diazepam: NOT DETECTED ng/mg{creat}
ETHYL ALCOHOL Enzymatic: NEGATIVE g/dL
FENTANYL: NEGATIVE
Fentanyl: NOT DETECTED ng/mg{creat}
Flunitrazepam: NOT DETECTED ng/mg{creat}
Lorazepam: NOT DETECTED ng/mg{creat}
METHADONE IA: NEGATIVE ng/mL
METHADONE MTB IA: NEGATIVE ng/mL
Midazolam: NOT DETECTED ng/mg{creat}
Norbuprenorphine: 8 ng/mg{creat}
Norfentanyl: NOT DETECTED ng/mg{creat}
OPIATE CLASS IA: NEGATIVE ng/mL
OXYCODONE CLASS IA: NEGATIVE ng/mL
Oxazepam: NOT DETECTED ng/mg{creat}
PHENCYCLIDINE IA: NEGATIVE ng/mL
TAPENTADOL, IA: NEGATIVE ng/mL
TRAMADOL IA: NEGATIVE ng/mL
Temazepam: NOT DETECTED ng/mg{creat}

## 2024-04-23 LAB — CANNABINOIDS, MS, UR RFX
Cannabinoids Confirmation: POSITIVE
Carboxy-THC: 282 ng/mg{creat}

## 2024-05-04 ENCOUNTER — Other Ambulatory Visit: Payer: Self-pay | Admitting: Advanced Practice Midwife

## 2024-05-04 DIAGNOSIS — F1191 Opioid use, unspecified, in remission: Secondary | ICD-10-CM

## 2024-05-08 ENCOUNTER — Other Ambulatory Visit: Payer: Self-pay | Admitting: Advanced Practice Midwife

## 2024-05-08 ENCOUNTER — Ambulatory Visit: Payer: Self-pay | Admitting: Advanced Practice Midwife

## 2024-05-08 DIAGNOSIS — F1120 Opioid dependence, uncomplicated: Secondary | ICD-10-CM | POA: Insufficient documentation

## 2024-05-08 MED ORDER — SUBOXONE 8-2 MG SL FILM
1.0000 | ORAL_FILM | Freq: Three times a day (TID) | SUBLINGUAL | 0 refills | Status: AC
Start: 2024-05-08 — End: 2024-05-22

## 2024-05-16 ENCOUNTER — Telehealth: Payer: Self-pay | Admitting: Family Medicine

## 2024-05-16 ENCOUNTER — Other Ambulatory Visit: Payer: Self-pay

## 2024-05-16 ENCOUNTER — Other Ambulatory Visit

## 2024-05-16 DIAGNOSIS — O0990 Supervision of high risk pregnancy, unspecified, unspecified trimester: Secondary | ICD-10-CM

## 2024-05-16 NOTE — Telephone Encounter (Signed)
 This patient does not want to come in on Tuesday's and does NOT want to be in the reach program she is aware that she will not get the same services that reach provides in her regular scheduled appointments.

## 2024-05-17 ENCOUNTER — Encounter: Admitting: Family Medicine

## 2024-05-17 ENCOUNTER — Ambulatory Visit: Payer: Self-pay | Admitting: Obstetrics and Gynecology

## 2024-05-17 DIAGNOSIS — O99019 Anemia complicating pregnancy, unspecified trimester: Secondary | ICD-10-CM | POA: Insufficient documentation

## 2024-05-17 DIAGNOSIS — O0990 Supervision of high risk pregnancy, unspecified, unspecified trimester: Secondary | ICD-10-CM

## 2024-05-17 DIAGNOSIS — O99012 Anemia complicating pregnancy, second trimester: Secondary | ICD-10-CM

## 2024-05-17 LAB — CBC
Hematocrit: 30.1 % — ABNORMAL LOW (ref 34.0–46.6)
Hemoglobin: 9.8 g/dL — ABNORMAL LOW (ref 11.1–15.9)
MCH: 31 pg (ref 26.6–33.0)
MCHC: 32.6 g/dL (ref 31.5–35.7)
MCV: 95 fL (ref 79–97)
Platelets: 275 x10E3/uL (ref 150–450)
RBC: 3.16 x10E6/uL — ABNORMAL LOW (ref 3.77–5.28)
RDW: 12.7 % (ref 11.7–15.4)
WBC: 11.5 x10E3/uL — ABNORMAL HIGH (ref 3.4–10.8)

## 2024-05-17 LAB — RPR: RPR Ser Ql: NONREACTIVE

## 2024-05-17 LAB — GLUCOSE TOLERANCE, 2 HOURS W/ 1HR
Glucose, 1 hour: 129 mg/dL (ref 70–179)
Glucose, 2 hour: 98 mg/dL (ref 70–152)
Glucose, Fasting: 85 mg/dL (ref 70–91)

## 2024-05-17 LAB — HIV ANTIBODY (ROUTINE TESTING W REFLEX): HIV Screen 4th Generation wRfx: NONREACTIVE

## 2024-05-19 ENCOUNTER — Encounter: Admitting: Obstetrics and Gynecology

## 2024-05-19 ENCOUNTER — Ambulatory Visit

## 2024-05-19 ENCOUNTER — Ambulatory Visit: Attending: Maternal & Fetal Medicine

## 2024-05-23 ENCOUNTER — Encounter: Admitting: Obstetrics & Gynecology

## 2024-05-31 ENCOUNTER — Other Ambulatory Visit: Payer: Self-pay

## 2024-05-31 NOTE — Telephone Encounter (Signed)
 Pt pharmacy requesting refill request for Suboxone .   Waddell, RN

## 2024-06-01 ENCOUNTER — Ambulatory Visit

## 2024-06-01 ENCOUNTER — Ambulatory Visit: Attending: Obstetrics and Gynecology | Admitting: Obstetrics and Gynecology

## 2024-06-01 ENCOUNTER — Other Ambulatory Visit: Payer: Self-pay | Admitting: *Deleted

## 2024-06-01 DIAGNOSIS — Z3A29 29 weeks gestation of pregnancy: Secondary | ICD-10-CM | POA: Insufficient documentation

## 2024-06-01 DIAGNOSIS — Z79891 Long term (current) use of opiate analgesic: Secondary | ICD-10-CM | POA: Insufficient documentation

## 2024-06-01 DIAGNOSIS — O99323 Drug use complicating pregnancy, third trimester: Secondary | ICD-10-CM | POA: Insufficient documentation

## 2024-06-01 DIAGNOSIS — Z148 Genetic carrier of other disease: Secondary | ICD-10-CM | POA: Insufficient documentation

## 2024-06-01 DIAGNOSIS — O99891 Other specified diseases and conditions complicating pregnancy: Secondary | ICD-10-CM | POA: Insufficient documentation

## 2024-06-01 DIAGNOSIS — O0990 Supervision of high risk pregnancy, unspecified, unspecified trimester: Secondary | ICD-10-CM

## 2024-06-01 DIAGNOSIS — F1120 Opioid dependence, uncomplicated: Secondary | ICD-10-CM

## 2024-06-01 DIAGNOSIS — Z362 Encounter for other antenatal screening follow-up: Secondary | ICD-10-CM

## 2024-06-01 NOTE — Progress Notes (Signed)
 After review, MFM consult with provider is not indicated for today  Arna Ranks, MD 06/01/2024 4:22 PM  [facility]

## 2024-06-14 ENCOUNTER — Telehealth: Payer: Self-pay | Admitting: Clinical

## 2024-06-14 ENCOUNTER — Other Ambulatory Visit: Payer: Self-pay | Admitting: Advanced Practice Midwife

## 2024-06-14 DIAGNOSIS — O99013 Anemia complicating pregnancy, third trimester: Secondary | ICD-10-CM

## 2024-06-14 MED ORDER — FERROUS SULFATE 325 (65 FE) MG PO TABS: 325.0000 mg | ORAL_TABLET | ORAL | Status: DC

## 2024-06-14 NOTE — Telephone Encounter (Signed)
Attempt call regarding referral; Left HIPPA-compliant message to call back Mikle Sternberg from [facility] at  [phone] (Ashelyn Mccravy's office).    

## 2024-06-15 NOTE — Telephone Encounter (Signed)
 Refill request denied by provider at this time until pt is seen for office appointment per Dr. Lola.   Waddell, RN

## 2024-06-22 ENCOUNTER — Ambulatory Visit (INDEPENDENT_AMBULATORY_CARE_PROVIDER_SITE_OTHER): Admitting: Family Medicine

## 2024-06-22 ENCOUNTER — Other Ambulatory Visit: Payer: Self-pay

## 2024-06-22 VITALS — BP 109/75 | HR 109 | Wt 202.0 lb

## 2024-06-22 DIAGNOSIS — F1191 Opioid use, unspecified, in remission: Secondary | ICD-10-CM

## 2024-06-22 DIAGNOSIS — F3170 Bipolar disorder, currently in remission, most recent episode unspecified: Secondary | ICD-10-CM

## 2024-06-22 DIAGNOSIS — O99323 Drug use complicating pregnancy, third trimester: Secondary | ICD-10-CM

## 2024-06-22 DIAGNOSIS — F1120 Opioid dependence, uncomplicated: Secondary | ICD-10-CM

## 2024-06-22 DIAGNOSIS — O0990 Supervision of high risk pregnancy, unspecified, unspecified trimester: Secondary | ICD-10-CM

## 2024-06-22 DIAGNOSIS — Z3A32 32 weeks gestation of pregnancy: Secondary | ICD-10-CM

## 2024-06-22 DIAGNOSIS — O99013 Anemia complicating pregnancy, third trimester: Secondary | ICD-10-CM | POA: Diagnosis not present

## 2024-06-22 DIAGNOSIS — O0993 Supervision of high risk pregnancy, unspecified, third trimester: Secondary | ICD-10-CM | POA: Diagnosis not present

## 2024-06-22 MED ORDER — BUPRENORPHINE HCL-NALOXONE HCL 8-2 MG SL SUBL: 0.5000 | SUBLINGUAL_TABLET | Freq: Two times a day (BID) | SUBLINGUAL | 0 refills | Status: AC

## 2024-06-22 MED ORDER — FERRIC MALTOL 30 MG PO CAPS: 30.0000 mg | ORAL_CAPSULE | ORAL | 2 refills | Status: DC

## 2024-06-22 NOTE — Progress Notes (Unsigned)
   PRENATAL VISIT NOTE  Subjective:  Jennifer  Atavia Marshall is a 23 y.o. G2P1001 at [redacted]w[redacted]d being seen today for ongoing prenatal care.  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  Patient reports no complaints.  Contractions: Irritability. Vag. Bleeding: None.  Movement: Present. Denies leaking of fluid.   The following portions of the patient's history were reviewed and updated as appropriate: allergies, current medications, past family history, past medical history, past social history, past surgical history and problem list.   Objective:    Vitals:   06/22/24 1041  BP: 109/75  Pulse: (!) 109  Weight: 202 lb (91.6 kg)    Fetal Status:  Fetal Heart Rate (bpm): 129   Movement: Present    General: Alert, oriented and cooperative. Patient is in no acute distress.  Skin: Skin is warm and dry. No rash noted.   Cardiovascular: Normal heart rate noted  Respiratory: Normal respiratory effort, no problems with respiration noted  Abdomen: Soft, gravid, appropriate for gestational age.  Pain/Pressure: Absent     Pelvic: {Blank single:19197::Cervical exam performed in the presence of a chaperone,Cervical exam deferred}        Extremities: Normal range of motion.  Edema: Trace  Mental Status: Normal mood and affect. Normal behavior. Normal judgment and thought content.   Assessment and Plan:  Pregnancy: G2P1001 at [redacted]w[redacted]d 1. Supervision of high risk pregnancy, antepartum (Primary) ***  2. Opioid use disorder in remission  - ToxAssure Flex 15,  Ur  3. Suboxone  maintenance treatment complicating pregnancy, antepartum (HCC) *** - ToxAssure Flex 15, Ur  4. Anemia during pregnancy in third trimester On iron, just started   5. Bipolar disorder in full remission, most recent episode unspecified type (HCC) ***    Preterm labor symptoms and general obstetric precautions including but not limited to vaginal bleeding, contractions, leaking of fluid and fetal movement were reviewed in detail with the patient. Please refer to After Visit Summary for other counseling recommendations.   No follow-ups on file.  Future Appointments  Date Time Provider Department Center  07/06/2024  1:55 PM Ilean Norleen GAILS, MD [facility]  07/07/2024  2:45 PM [facility] PROVIDER 1 [facility]  07/07/2024  3:00 PM [facility] US1 [facility]  07/22/2024 10:15 AM Ilean Norleen GAILS, MD [facility]  07/27/2024 10:15 AM Zina Jerilynn LABOR, MD [facility]  08/03/2024  8:55 AM Fredirick Glenys RAMAN, MD [facility]  08/12/2024 10:55 AM Ilean Norleen GAILS, MD [facility]  08/19/2024  8:55 AM Lola Donnice HERO, MD [facility]    Suzen Maryan Masters, MD

## 2024-06-23 LAB — ANEMIA PROFILE B
Basophils Absolute: 0.1 x10E3/uL (ref 0.0–0.2)
Basos: 0 %
EOS (ABSOLUTE): 0.2 x10E3/uL (ref 0.0–0.4)
Eos: 1 %
Ferritin: 31 ng/mL (ref 15–150)
Folate: 7.7 ng/mL (ref >3.0)
Hematocrit: 28.9 % — ABNORMAL LOW (ref 34.0–46.6)
Hemoglobin: 9.6 g/dL — ABNORMAL LOW (ref 11.1–15.9)
Immature Grans (Abs): 0.1 x10E3/uL (ref 0.0–0.1)
Immature Granulocytes: 1 %
Iron Saturation: 13 % — ABNORMAL LOW (ref 15–55)
Iron: 58 ug/dL (ref 27–159)
Lymphocytes Absolute: 2.1 x10E3/uL (ref 0.7–3.1)
Lymphs: 16 %
MCH: 31.1 pg (ref 26.6–33.0)
MCHC: 33.2 g/dL (ref 31.5–35.7)
MCV: 94 fL (ref 79–97)
Monocytes Absolute: 0.8 x10E3/uL (ref 0.1–0.9)
Monocytes: 6 %
Neutrophils Absolute: 9.8 x10E3/uL — ABNORMAL HIGH (ref 1.4–7.0)
Neutrophils: 76 %
Platelets: 316 x10E3/uL (ref 150–450)
RBC: 3.09 x10E6/uL — ABNORMAL LOW (ref 3.77–5.28)
RDW: 12.5 % (ref 11.7–15.4)
Retic Ct Pct: 1.7 % (ref 0.6–2.6)
Total Iron Binding Capacity: 437 ug/dL (ref 250–450)
UIBC: 379 ug/dL (ref 131–425)
Vitamin B-12: 260 pg/mL (ref 232–1245)
WBC: 13 x10E3/uL — ABNORMAL HIGH (ref 3.4–10.8)

## 2024-06-24 ENCOUNTER — Ambulatory Visit: Payer: Self-pay | Admitting: Family Medicine

## 2024-06-25 LAB — CANNABINOIDS, MS, UR RFX
Cannabinoids Confirmation: POSITIVE
Carboxy-THC: 157 ng/mg{creat}

## 2024-06-25 LAB — TOXASSURE FLEX 15, UR
6-ACETYLMORPHINE IA: NEGATIVE ng/mL
7-aminoclonazepam: NOT DETECTED ng/mg{creat}
AMPHETAMINES IA: NEGATIVE ng/mL
Alpha-hydroxyalprazolam: NOT DETECTED ng/mg{creat}
Alpha-hydroxymidazolam: NOT DETECTED ng/mg{creat}
Alpha-hydroxytriazolam: NOT DETECTED ng/mg{creat}
Alprazolam: NOT DETECTED ng/mg{creat}
BARBITURATES IA: NEGATIVE ng/mL
BUPRENORPHINE: NEGATIVE
Benzodiazepines: NEGATIVE
Buprenorphine: NOT DETECTED ng/mg{creat}
COCAINE METABOLITE IA: NEGATIVE ng/mL
Clonazepam: NOT DETECTED ng/mg{creat}
Creatinine: 14 mg/dL — ABNORMAL LOW (ref >=20)
Desalkylflurazepam: NOT DETECTED ng/mg{creat}
Desmethyldiazepam: NOT DETECTED ng/mg{creat}
Desmethylflunitrazepam: NOT DETECTED ng/mg{creat}
Diazepam: NOT DETECTED ng/mg{creat}
ETHYL ALCOHOL Enzymatic: NEGATIVE g/dL
FENTANYL: NEGATIVE
Fentanyl: NOT DETECTED ng/mg{creat}
Flunitrazepam: NOT DETECTED ng/mg{creat}
Lorazepam: NOT DETECTED ng/mg{creat}
METHADONE IA: NEGATIVE ng/mL
METHADONE MTB IA: NEGATIVE ng/mL
Midazolam: NOT DETECTED ng/mg{creat}
Norbuprenorphine: NOT DETECTED ng/mg{creat}
Norfentanyl: NOT DETECTED ng/mg{creat}
OPIATE CLASS IA: NEGATIVE ng/mL
OXYCODONE CLASS IA: NEGATIVE ng/mL
Oxazepam: NOT DETECTED ng/mg{creat}
PHENCYCLIDINE IA: NEGATIVE ng/mL
TAPENTADOL, IA: NEGATIVE ng/mL
TRAMADOL IA: NEGATIVE ng/mL
Temazepam: NOT DETECTED ng/mg{creat}

## 2024-07-05 ENCOUNTER — Telehealth (INDEPENDENT_AMBULATORY_CARE_PROVIDER_SITE_OTHER): Admitting: Family Medicine

## 2024-07-05 ENCOUNTER — Encounter: Payer: Self-pay | Admitting: Family Medicine

## 2024-07-05 VITALS — BP 119/74 | HR 96

## 2024-07-05 DIAGNOSIS — F1191 Opioid use, unspecified, in remission: Secondary | ICD-10-CM | POA: Diagnosis not present

## 2024-07-05 DIAGNOSIS — O0990 Supervision of high risk pregnancy, unspecified, unspecified trimester: Secondary | ICD-10-CM

## 2024-07-05 DIAGNOSIS — O99343 Other mental disorders complicating pregnancy, third trimester: Secondary | ICD-10-CM | POA: Diagnosis not present

## 2024-07-05 DIAGNOSIS — O0993 Supervision of high risk pregnancy, unspecified, third trimester: Secondary | ICD-10-CM

## 2024-07-05 DIAGNOSIS — O99323 Drug use complicating pregnancy, third trimester: Secondary | ICD-10-CM | POA: Diagnosis not present

## 2024-07-05 DIAGNOSIS — Z3A34 34 weeks gestation of pregnancy: Secondary | ICD-10-CM

## 2024-07-05 DIAGNOSIS — O99013 Anemia complicating pregnancy, third trimester: Secondary | ICD-10-CM

## 2024-07-05 DIAGNOSIS — R8271 Bacteriuria: Secondary | ICD-10-CM

## 2024-07-05 DIAGNOSIS — Q379 Unspecified cleft palate with unilateral cleft lip: Secondary | ICD-10-CM

## 2024-07-05 DIAGNOSIS — F3170 Bipolar disorder, currently in remission, most recent episode unspecified: Secondary | ICD-10-CM

## 2024-07-05 DIAGNOSIS — O99891 Other specified diseases and conditions complicating pregnancy: Secondary | ICD-10-CM

## 2024-07-05 DIAGNOSIS — D649 Anemia, unspecified: Secondary | ICD-10-CM

## 2024-07-05 NOTE — Progress Notes (Unsigned)
   Subjective:  Jennifer  Stefany Marshall is a 23 y.o. G2P1001 at [redacted]w[redacted]d being seen today for ongoing prenatal care.  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  Patient reports no complaints.  Contractions: Not present. Vag. Bleeding: None.  Movement: Present. Denies leaking of fluid.   The following portions of the patient's history were reviewed and updated as appropriate: allergies, current medications, past family history, past medical history, past social history, past surgical history and problem list. Problem list updated.  Objective:   Vitals:   07/05/24 1556  BP: 119/74  Pulse: 96    Fetal Status:     Movement: Present     General:  Alert, oriented and cooperative. Patient is in no acute distress.  Skin: Skin is warm and dry. No rash noted.   Cardiovascular: Normal heart rate noted  Respiratory: Normal respiratory effort, no problems with respiration noted  Abdomen: Soft, gravid, appropriate for gestational age. Pain/Pressure: Present (pelvis and hips sore, ongoing)     Pelvic: Vag. Bleeding: None     Cervical exam deferred        Extremities: Normal range of motion.  Edema: None  Mental Status: Normal mood and affect. Normal behavior. Normal judgment and thought content.   Urinalysis:      PDMP not reviewed this encounter.    Last UDS: Lab Results  Component Value Date   CREATIUR 14 (L) 06/22/2024      Assessment and Plan:  Pregnancy: G2P1001 at [redacted]w[redacted]d  1. Supervision of high risk pregnancy, antepartum  (Primary) ***Virtual visit BP normal on home cuff, reports good featl movement  2. Opioid use disorder in remission Last [facility] visit 04/19/2024, at that time *** Denies illicits Reports that Tuesdays are impossible for her to come ***[facility]  3. Cleft lip and cleft palate-maternal Normal anatomy scan  4. Bipolar disorder in full remission, most recent episode unspecified type [facility]) Previously referred to [facility], ***refer to psych Reported as taking lamictal  100 mg daily, reports she is stable on this  5. Asymptomatic bacteriuria during pregnancy TOC neg in May Resolved from PL  6. Anemia during pregnancy in third trimester Lab Results  Component Value Date   HGB 9.6 (L) 06/22/2024   On PO iron  Preterm labor symptoms and general obstetric precautions including but not limited to vaginal bleeding, contractions, leaking of fluid and fetal movement were reviewed in detail with the patient. Please refer to After Visit Summary for other counseling recommendations.  No follow-ups on file.  Future Appointments  Date Time Provider Department Center  07/19/2024  1:55 PM [name] Simmers , CNM [facility]  07/26/2024  1:55 PM Lola Donnice HERO, MD [facility]  07/28/2024 11:15 AM [facility] PROVIDER 1 [facility]  07/28/2024 11:30 AM [facility] US4 [facility]  08/02/2024  1:15 PM Lola Donnice HERO, MD [facility]  08/09/2024  1:35 PM Lola Donnice HERO, MD [facility]  08/16/2024  1:35 PM Lola Donnice HERO, MD [facility]     Lola Donnice HERO, MD

## 2024-07-06 ENCOUNTER — Encounter: Admitting: Family Medicine

## 2024-07-07 ENCOUNTER — Ambulatory Visit

## 2024-07-07 ENCOUNTER — Other Ambulatory Visit

## 2024-07-07 ENCOUNTER — Other Ambulatory Visit: Payer: Self-pay

## 2024-07-07 ENCOUNTER — Encounter (HOSPITAL_COMMUNITY): Payer: Self-pay | Admitting: Obstetrics & Gynecology

## 2024-07-07 ENCOUNTER — Inpatient Hospital Stay (HOSPITAL_COMMUNITY)
Admission: AD | Admit: 2024-07-07 | Discharge: 2024-07-08 | Disposition: A | Discharge status: Home / Self Care | Attending: Obstetrics & Gynecology | Admitting: Obstetrics & Gynecology

## 2024-07-07 DIAGNOSIS — R102 Pelvic and perineal pain: Secondary | ICD-10-CM | POA: Insufficient documentation

## 2024-07-07 DIAGNOSIS — B9689 Other specified bacterial agents as the cause of diseases classified elsewhere: Secondary | ICD-10-CM | POA: Insufficient documentation

## 2024-07-07 DIAGNOSIS — F1191 Opioid use, unspecified, in remission: Secondary | ICD-10-CM

## 2024-07-07 DIAGNOSIS — F1120 Opioid dependence, uncomplicated: Secondary | ICD-10-CM

## 2024-07-07 DIAGNOSIS — O99323 Drug use complicating pregnancy, third trimester: Secondary | ICD-10-CM | POA: Insufficient documentation

## 2024-07-07 DIAGNOSIS — O26893 Other specified pregnancy related conditions, third trimester: Secondary | ICD-10-CM | POA: Insufficient documentation

## 2024-07-07 DIAGNOSIS — Z3A34 34 weeks gestation of pregnancy: Secondary | ICD-10-CM | POA: Insufficient documentation

## 2024-07-07 DIAGNOSIS — F1111 Opioid abuse, in remission: Secondary | ICD-10-CM | POA: Diagnosis not present

## 2024-07-07 DIAGNOSIS — O23593 Infection of other part of genital tract in pregnancy, third trimester: Secondary | ICD-10-CM | POA: Diagnosis not present

## 2024-07-07 DIAGNOSIS — N898 Other specified noninflammatory disorders of vagina: Secondary | ICD-10-CM

## 2024-07-07 DIAGNOSIS — Z3689 Encounter for other specified antenatal screening: Secondary | ICD-10-CM

## 2024-07-07 NOTE — MAU Provider Note (Signed)
 Obstetric Attending MAU Note  Chief Complaint:  Back Pain and Pelvic Pain   None    HPI: Jennifer  Corrie Marshall is a 23 y.o. G2P1001 at [redacted]w[redacted]d who presents to maternity admissions reporting lower back pain that radiates to her hips as well as pelvic pain.  She is unsure if she's feeling contractions, denies VB and LOF, endorses +FM but reports it has been a little less tonight.  Reports she's also been having thick yellow discharge. No other symptoms.    Pregnancy Course: Receives care at [facility], part of REACH clinic Patient Active Problem List   Diagnosis Date Noted   Anemia in pregnancy 05/17/2024   Suboxone  maintenance treatment complicating pregnancy, antepartum (HCC) 05/08/2024   Bipolar affective disorder (HCC) 04/19/2024   Marijuana use during pregnancy 03/25/2024   Nonimmune to hepatitis B virus 03/01/2024   Cystic fibrosis carrier 01/27/2024   Supervision of high risk pregnancy, antepartum 01/19/2024   Opioid use disorder in remission 01/19/2024   Cleft lip and cleft palate-maternal 05/26/2018    Past Medical History:  Diagnosis Date   Bipolar affective disorder (HCC) 04/19/2024   Cleft lip and cleft palate-maternal 05/26/2018   [X]  Genetic counseling     Opioid use disorder in remission 01/19/2024    OB History  Gravida Para Term Preterm AB Living  2 1 1  0 0 1  SAB IAB Ectopic Multiple Live Births  0 0 0  1    # Outcome Date GA Lbr Len/2nd Weight Sex Type Anes PTL Lv  2 Current           1 Term 10/19/18 [redacted]w[redacted]d / 00:39 3340 g F Vag-Spont EPI N LIV    Past Surgical History:  Procedure Laterality Date   CLEFT PALATE REPAIR      Family History: Family History  Problem Relation Age of Onset   Atrial fibrillation Mother    Bipolar disorder Mother    ADD / ADHD Brother     Social History: Social History   Tobacco Use   Smoking status: Every Day    Current packs/day: 0.25    Types: Cigarettes   Smokeless tobacco: Never  Vaping Use   Vaping status: Every  Day   Substances: Nicotine, CBD  Substance Use Topics   Alcohol use: No   Drug use: No    Comment: SUBOXONE     Allergies: No Known Allergies  Medications Prior to Admission  Medication Sig Dispense Refill Last Dose/Taking   lamoTRIgine  (LAMICTAL ) 100 MG tablet Take 1 tablet (100 mg total) by mouth daily. 30 tablet 1 07/07/2024   Prenat-Fe Poly-Methfol-FA-DHA (VITAFOL  ULTRA) 29-0.6-0.4-200 MG CAPS Take 1 capsule by mouth daily. 30 capsule 11 07/07/2024   buprenorphine -naloxone  (SUBOXONE ) 8-2 mg SUBL SL tablet Place 0.5 tablets under the tongue 2 (two) times daily for 15 days. 15 tablet 0    Ferric Maltol  30 MG CAPS Take 1 capsule (30 mg total) by mouth every other day. 17 capsule 2    naloxone  (NARCAN ) nasal spray 4 mg/0.1 mL Use as needed to reverse opioid overdose (Patient not taking: Reported on 07/05/2024) 2 each 11    ondansetron  (ZOFRAN ) 4 MG tablet Take 1 tablet (4 mg total) by mouth 4 (four) times daily as needed for nausea or vomiting. 30 tablet 2     ROS: Pertinent findings in history of present illness.  Physical Exam  Blood pressure 111/62, pulse (!) 116, temperature 98.5 F (36.9 C), temperature source Oral, resp. rate 17, height 5' 7 (1.702  m), weight 92.5 kg, last menstrual period 11/04/2023, SpO2 100%. CONSTITUTIONAL: Well-developed, well-nourished female in no acute distress.  NECK: Normal range of motion, supple, no masses SKIN: Skin is warm and dry. No rash noted. Not diaphoretic. No erythema. No pallor. NEUROLGIC: Alert and oriented to person, place, and time.  PSYCHIATRIC: Normal mood and affect. Normal behavior. Normal judgment and thought content. CARDIOVASCULAR: Elevated heart rate noted, regular rhythm RESPIRATORY: Effort and breath sounds normal, no problems with respiration noted ABDOMEN: Soft, nontender, nondistended, gravid appropriate for gestational age MUSCULOSKELETAL: Normal range of motion. No edema and no tenderness. 2+ distal pulses.  PELVIC EXAM:  NEFG, yellow discharge seen and sample obtained, no blood (done in presence of RN as chaperone) Dilation: Closed Presentation: Vertex Exam by:: Khloie Hamada, MD  FHT:  Baseline 135 , moderate variability, accelerations present, no decelerations Contractions: flat   Labs: No results found for this or any previous visit (from the past 24 hours).  Imaging:  No results found.  MAU Course: Category 1 FHR tracing Closed cervix Samples taken for pelvic cultures  Assessment: 1. Pelvic pain affecting pregnancy in third trimester, antepartum   2. Vaginal discharge during pregnancy in third trimester   3. [redacted] weeks gestation of pregnancy     Plan: ***     Allergies as of 07/07/2024   No Known Allergies   Med Rec must be completed prior to using this [facility]***       Mykaila Blunck, Gloris LABOR, MD 07/07/2024 11:48 PM

## 2024-07-07 NOTE — MAU Note (Signed)
 MAU Triage Note  Jennifer  Markeya Marshall is a 23 y.o. at [redacted]w[redacted]d here in MAU reporting: lower back pain that radiates to her hips as well as pelvic pain.  Reports she was induced so unsure if she's feeling ctx. Reports she's also been having thick yellow discharge. Denies VB and LOF, endorses +FM but reports it has been a little less tonight.    Onset of complaint: this morning Pain score: 7/10 Vitals:   07/07/24 2257  BP: 111/62  Pulse: (!) 116  Resp: 17  Temp: 98.5 F (36.9 C)  SpO2: 100%     FHT: 135  Lab orders placed from triage: UA

## 2024-07-08 LAB — GC/CHLAMYDIA PROBE AMP (~~LOC~~) NOT AT ARMC
Chlamydia: NEGATIVE
Comment: NEGATIVE
Comment: NORMAL
Neisseria Gonorrhea: NEGATIVE

## 2024-07-08 LAB — URINALYSIS, ROUTINE W REFLEX MICROSCOPIC
Bilirubin Urine: NEGATIVE
Glucose, UA: NEGATIVE mg/dL
Hgb urine dipstick: NEGATIVE
Ketones, ur: NEGATIVE mg/dL
Leukocytes,Ua: NEGATIVE
Nitrite: NEGATIVE
Protein, ur: NEGATIVE mg/dL
Specific Gravity, Urine: 1.008 (ref 1.005–1.030)
pH: 7 (ref 5.0–8.0)

## 2024-07-08 LAB — WET PREP, GENITAL
Sperm: NONE SEEN
Trich, Wet Prep: NONE SEEN
WBC, Wet Prep HPF POC: <10 (ref <10)
Yeast Wet Prep HPF POC: NONE SEEN

## 2024-07-08 MED ORDER — METRONIDAZOLE 500 MG PO TABS: 500.0000 mg | ORAL_TABLET | Freq: Two times a day (BID) | ORAL | 0 refills | Status: AC

## 2024-07-19 ENCOUNTER — Ambulatory Visit

## 2024-07-19 ENCOUNTER — Encounter: Admitting: Advanced Practice Midwife

## 2024-07-19 ENCOUNTER — Other Ambulatory Visit: Payer: Self-pay

## 2024-07-19 ENCOUNTER — Ambulatory Visit: Admitting: Advanced Practice Midwife

## 2024-07-19 VITALS — BP 121/72 | HR 97 | Wt 208.8 lb

## 2024-07-19 DIAGNOSIS — Z3A36 36 weeks gestation of pregnancy: Secondary | ICD-10-CM

## 2024-07-19 DIAGNOSIS — O99320 Drug use complicating pregnancy, unspecified trimester: Secondary | ICD-10-CM

## 2024-07-19 DIAGNOSIS — Z23 Encounter for immunization: Secondary | ICD-10-CM

## 2024-07-19 DIAGNOSIS — O99013 Anemia complicating pregnancy, third trimester: Secondary | ICD-10-CM

## 2024-07-19 DIAGNOSIS — F1290 Cannabis use, unspecified, uncomplicated: Secondary | ICD-10-CM

## 2024-07-19 DIAGNOSIS — F1191 Opioid use, unspecified, in remission: Secondary | ICD-10-CM

## 2024-07-19 DIAGNOSIS — O99323 Drug use complicating pregnancy, third trimester: Secondary | ICD-10-CM

## 2024-07-19 DIAGNOSIS — O0993 Supervision of high risk pregnancy, unspecified, third trimester: Secondary | ICD-10-CM | POA: Diagnosis not present

## 2024-07-19 DIAGNOSIS — F1120 Opioid dependence, uncomplicated: Secondary | ICD-10-CM

## 2024-07-19 DIAGNOSIS — Q379 Unspecified cleft palate with unilateral cleft lip: Secondary | ICD-10-CM

## 2024-07-19 DIAGNOSIS — F3170 Bipolar disorder, currently in remission, most recent episode unspecified: Secondary | ICD-10-CM

## 2024-07-19 DIAGNOSIS — Z141 Cystic fibrosis carrier: Secondary | ICD-10-CM

## 2024-07-19 DIAGNOSIS — O0990 Supervision of high risk pregnancy, unspecified, unspecified trimester: Secondary | ICD-10-CM

## 2024-07-19 MED ORDER — BUPRENORPHINE HCL-NALOXONE HCL 8-2 MG SL SUBL: 1.5000 | SUBLINGUAL_TABLET | Freq: Every day | SUBLINGUAL | 0 refills | Status: AC

## 2024-07-19 NOTE — Progress Notes (Signed)
 Subjective:  Jennifer  Mathilde Marshall is a 23 y.o. G2P1001 at [redacted]w[redacted]d being seen today for ongoing prenatal care.  She is currently monitored for the following issues for this high-risk pregnancy and has Cleft lip and cleft palate-maternal; Supervision of high risk pregnancy, antepartum; Opioid use disorder in remission; Cystic fibrosis carrier; Nonimmune to hepatitis B virus; Marijuana use during pregnancy; Bipolar affective disorder (HCC); Suboxone  maintenance treatment complicating pregnancy, antepartum (HCC); and Anemia in pregnancy on their problem list.  Patient reports occasional contractions an passing mucus plug.  Contractions: Irritability. Vag. Bleeding: None.  Movement: Present. Denies leaking of fluid.   Previous ToxAssures not showing prescribed Suboxone . States she had run out after Rx in July. Last seen 06/22/24. Was prescribed 8-2 tabs 1/2 tab BID on 06/22/24. States she took them as prescribed and did not have withdrawal Sx but did have cravings on that dose, but denies using unprescribed opiates. Completed that 2 week Rx about 2 weeks ago. Had withdrawals which has have resolved. Dempsey discussion about inconsistencies between Rx's and Toxscreens not showing buprenorphine  or only showing Buprenorphine  and not norbuprenorphine (or expected amount) when pt state she had been taking Suboxone . Unclear whether it is feasable that she has been running out vs if she may be diverting. Explained that we want to provide support to help her not take unprescribed substances but also can't prescribe if she is diverting.  Asked if she truly feels like she   The following portions of the patient's history were reviewed and updated as appropriate: allergies, current medications, past family history, past medical history, past social history, past surgical history and problem list. Problem list updated.  Objective:   Vitals:   07/19/24 1346  BP: 121/72  Pulse: 97  Weight: 208 lb 12.8 oz (94.7 kg)     Fetal Status: Fetal Heart Rate (bpm): 139   Movement: Present     General:  Alert, oriented and cooperative. Patient is in no acute distress.  Skin: Skin is warm and dry. No rash noted.   Cardiovascular: Normal heart rate noted  Respiratory: Normal respiratory effort, no problems with respiration noted  Abdomen: Soft, gravid, appropriate for gestational age. Pain/Pressure: Present     Pelvic: Vag. Bleeding: None Vag D/C Character: Mucous (yellow/pink in color)   Cervical exam performed        Extremities: Normal range of motion.  Edema: None  Mental Status: Normal mood and affect. Normal behavior. Normal judgment and thought content.   Urinalysis:      PDMP reviewed during this encounter.   Last UDS: Lab Results  Component Value Date   CREATIUR 14 (L) 06/22/2024     Assessment and Plan:  Pregnancy: G2P1001 at [redacted]w[redacted]d  1. Suboxone  maintenance treatment complicating pregnancy, antepartum (HCC) (Primary) - ToxAssure Flex 15, Ur - Concern for inconsistency in Rx's and ToxAssures.Discussed that we have some concern for possible diversion. Will Rx 8 days only. Needs in-person visit and ToxAssures C/W what we are prescribing in order for us  to continue prescribing to her.   2. Opioid use disorder in remission - ToxAssure Flex 15, Ur  3. Supervision of high risk pregnancy, antepartum - Tdap vaccine greater than or equal to 7yo IM - Culture, beta strep (group b only) - ToxAssure Flex 15, Ur - Respiratory syncytial virus vaccine, preF, subunit, bivalent,(Abrysvo)  4. Bipolar disorder in full remission, most recent episode unspecified type  5. Anemia during pregnancy in third trimester - PO iron 6. Cystic fibrosis carrier - GC  done 7. Marijuana use during pregnancy - ToxAssure Flex 15, Ur  8. Cleft lip and cleft palate-maternal - GC done 9. [redacted] weeks gestation of pregnancy - Tdap vaccine greater than or equal to 7yo IM - Culture, beta strep (group b only) - Respiratory  syncytial virus vaccine, preF, subunit, bivalent,(Abrysvo) - [facility]  Preterm labor symptoms and general obstetric precautions including but not limited to vaginal bleeding, contractions, leaking of fluid and fetal movement were reviewed in detail with the patient. Please refer to After Visit Summary for other counseling recommendations.   No follow-ups on file.   Future Appointments  Date Time Provider Department Center  07/26/2024  1:55 PM Lola Donnice HERO, MD [facility]  07/28/2024 11:15 AM [facility] PROVIDER 1 [facility]  07/28/2024 11:30 AM [facility] US4 [facility]  08/02/2024  1:15 PM Lola Donnice HERO, MD [facility]  08/09/2024  1:35 PM Lola Donnice HERO, MD [facility]  08/16/2024  1:35 PM Lola Donnice HERO, MD [facility]    Total face-to-face time with patient: 20 minutes.  Over 50% of encounter was spent on counseling and coordination of care.   Deldrick Linch  [name] HOWARD 07/19/2024 2:26 PM Center for [employer], [facility]

## 2024-07-19 NOTE — Patient Instructions (Signed)
 TDaP Vaccine Pregnancy Get the Whooping Cough Vaccine While You Are Pregnant (CDC)  It is important for women to get the whooping cough vaccine in the third trimester of each pregnancy. Vaccines are the best way to prevent this disease. There are 2 different whooping cough vaccines. Both vaccines combine protection against whooping cough, tetanus and diphtheria, but they are for different age groups: Tdap: for everyone 11 years or older, including pregnant women  DTaP: for children 2 months through 10 years of age  You need the whooping cough vaccine during each of your pregnancies The recommended time to get the shot is during your 27th through 36th week of pregnancy, preferably during the earlier part of this time period. The Centers for Disease Control and Prevention (CDC) recommends that pregnant women receive the whooping cough vaccine for adolescents and adults (called Tdap vaccine) during the third trimester of each pregnancy. The recommended time to get the shot is during your 27th through 36th week of pregnancy, preferably during the earlier part of this time period. This replaces the original recommendation that pregnant women get the vaccine only if they had not previously received it. The [employer] of Obstetricians and Gynecologists and the [employer] support this recommendation.  You should get the whooping cough vaccine while pregnant to pass protection to your baby frame support disabled and/or not supported in this browser  Learn why Jennifer Marshall decided to get the whooping cough vaccine in her 3rd trimester of pregnancy and how her baby girl was born with some protection against the disease. Also available on YouTube. After receiving the whooping cough vaccine, your body will create protective antibodies (proteins produced by the body to fight off diseases) and pass some of them to your baby before birth. These antibodies provide your baby some short-term  protection against whooping cough in early life. These antibodies can also protect your baby from some of the more serious complications that come along with whooping cough. Your protective antibodies are at their highest about 2 weeks after getting the vaccine, but it takes time to pass them to your baby. So the preferred time to get the whooping cough vaccine is early in your third trimester. The amount of whooping cough antibodies in your body decreases over time. That is why CDC recommends you get a whooping cough vaccine during each pregnancy. Doing so allows each of your babies to get the greatest number of protective antibodies from you. This means each of your babies will get the best protection possible against this disease.  Getting the whooping cough vaccine while pregnant is better than getting the vaccine after you give birth Whooping cough vaccination during pregnancy is ideal so your baby will have short-term protection as soon as he is born. This early protection is important because your baby will not start getting his whooping cough vaccines until he is 2 months old. These first few months of life are when your baby is at greatest risk for catching whooping cough. This is also when he's at greatest risk for having severe, potentially life-threating complications from the infection. To avoid that gap in protection, it is best to get a whooping cough vaccine during pregnancy. You will then pass protection to your baby before he is born. To continue protecting your baby, he should get whooping cough vaccines starting at 2 months old. You may never have gotten the Tdap vaccine before and did not get it during this pregnancy. If so, you should make sure  to get the vaccine immediately after you give birth, before leaving the hospital or birthing center. It will take about 2 weeks before your body develops protection (antibodies) in response to the vaccine. Once you have protection from the vaccine,  you are less likely to give whooping cough to your newborn while caring for him. But remember, your baby will still be at risk for catching whooping cough from others. A recent study looked to see how effective Tdap was at preventing whooping cough in babies whose mothers got the vaccine while pregnant or in the hospital after giving birth. The study found that getting Tdap between 27 through 36 weeks of pregnancy is 85% more effective at preventing whooping cough in babies younger than 2 months old. Blood tests cannot tell if you need a whooping cough vaccine There are no blood tests that can tell you if you have enough antibodies in your body to protect yourself or your baby against whooping cough. Even if you have been sick with whooping cough in the past or previously received the vaccine, you still should get the vaccine during each pregnancy. Breastfeeding may pass some protective antibodies onto your baby By breastfeeding, you may pass some antibodies you have made in response to the vaccine to your baby. When you get a whooping cough vaccine during your pregnancy, you will have antibodies in your breast milk that you can share with your baby as soon as your milk comes in. However, your baby will not get protective antibodies immediately if you wait to get the whooping cough vaccine until after delivering your baby. This is because it takes about 2 weeks for your body to create antibodies. Learn more about the health benefits of breastfeeding.

## 2024-07-22 ENCOUNTER — Encounter: Admitting: Family Medicine

## 2024-07-24 LAB — CULTURE, BETA STREP (GROUP B ONLY): Strep Gp B Culture: NEGATIVE

## 2024-07-25 LAB — TOXASSURE FLEX 15, UR
6-ACETYLMORPHINE IA: NEGATIVE ng/mL
7-aminoclonazepam: NOT DETECTED ng/mg{creat}
AMPHETAMINES IA: NEGATIVE ng/mL
Alpha-hydroxyalprazolam: NOT DETECTED ng/mg{creat}
Alpha-hydroxymidazolam: NOT DETECTED ng/mg{creat}
Alpha-hydroxytriazolam: NOT DETECTED ng/mg{creat}
Alprazolam: NOT DETECTED ng/mg{creat}
BARBITURATES IA: NEGATIVE ng/mL
BUPRENORPHINE: NEGATIVE
Benzodiazepines: NEGATIVE
Buprenorphine: NOT DETECTED ng/mg{creat}
COCAINE METABOLITE IA: NEGATIVE ng/mL
Clonazepam: NOT DETECTED ng/mg{creat}
Creatinine: 34 mg/dL (ref >=20)
Desalkylflurazepam: NOT DETECTED ng/mg{creat}
Desmethyldiazepam: NOT DETECTED ng/mg{creat}
Desmethylflunitrazepam: NOT DETECTED ng/mg{creat}
Diazepam: NOT DETECTED ng/mg{creat}
ETHYL ALCOHOL Enzymatic: NEGATIVE g/dL
FENTANYL: NEGATIVE
Fentanyl: NOT DETECTED ng/mg{creat}
Flunitrazepam: NOT DETECTED ng/mg{creat}
Lorazepam: NOT DETECTED ng/mg{creat}
METHADONE IA: NEGATIVE ng/mL
METHADONE MTB IA: NEGATIVE ng/mL
Midazolam: NOT DETECTED ng/mg{creat}
Norbuprenorphine: NOT DETECTED ng/mg{creat}
Norfentanyl: NOT DETECTED ng/mg{creat}
OPIATE CLASS IA: NEGATIVE ng/mL
OXYCODONE CLASS IA: NEGATIVE ng/mL
Oxazepam: NOT DETECTED ng/mg{creat}
PHENCYCLIDINE IA: NEGATIVE ng/mL
TAPENTADOL, IA: NEGATIVE ng/mL
TRAMADOL IA: NEGATIVE ng/mL
Temazepam: NOT DETECTED ng/mg{creat}

## 2024-07-25 LAB — CANNABINOIDS, MS, UR RFX
Cannabinoids Confirmation: POSITIVE
Carboxy-THC: 56 ng/mg{creat}

## 2024-07-26 ENCOUNTER — Encounter: Payer: Self-pay | Admitting: Family Medicine

## 2024-07-26 ENCOUNTER — Telehealth: Admitting: Advanced Practice Midwife

## 2024-07-26 VITALS — BP 125/84 | HR 105

## 2024-07-26 DIAGNOSIS — F3170 Bipolar disorder, currently in remission, most recent episode unspecified: Secondary | ICD-10-CM

## 2024-07-26 DIAGNOSIS — F1120 Opioid dependence, uncomplicated: Secondary | ICD-10-CM | POA: Diagnosis not present

## 2024-07-26 DIAGNOSIS — O0993 Supervision of high risk pregnancy, unspecified, third trimester: Secondary | ICD-10-CM

## 2024-07-26 DIAGNOSIS — Z3A37 37 weeks gestation of pregnancy: Secondary | ICD-10-CM

## 2024-07-26 DIAGNOSIS — O0990 Supervision of high risk pregnancy, unspecified, unspecified trimester: Secondary | ICD-10-CM

## 2024-07-26 DIAGNOSIS — O99323 Drug use complicating pregnancy, third trimester: Secondary | ICD-10-CM

## 2024-07-26 DIAGNOSIS — O99343 Other mental disorders complicating pregnancy, third trimester: Secondary | ICD-10-CM | POA: Diagnosis not present

## 2024-07-26 DIAGNOSIS — Z141 Cystic fibrosis carrier: Secondary | ICD-10-CM

## 2024-07-26 NOTE — Progress Notes (Signed)
 Subjective:  Virtual Visit via Video Note  I connected with Jennifer  Edsel Marshall on 07/26/24 at  1:55 PM EDT by a video enabled telemedicine application and verified that I am speaking with the correct person using two identifiers.  Location: Patient: Home Provider: Office private exam room   I discussed the limitations of evaluation and management by telemedicine and the availability of in person appointments. The patient expressed understanding and agreed to proceed.   Jennifer  Shristi Marshall is a 23 y.o. G2P1001 at [redacted]w[redacted]d being seen today for ongoing prenatal care.  She is currently monitored for the following issues for this high-risk pregnancy and has Cleft lip and cleft palate-maternal; Supervision of high risk pregnancy, antepartum; Opioid use disorder in remission; Cystic fibrosis carrier; Nonimmune to hepatitis B virus; Marijuana use during pregnancy; Bipolar affective disorder (HCC); Suboxone  maintenance treatment complicating pregnancy, antepartum (HCC); and Anemia in pregnancy on their problem list.  Patient reports no complaints.  Contractions: Not present. Vag. Bleeding: None.  Movement: Present. Denies leaking of fluid.   Reports taking Suboxone  as Rx'd. Tox Assure at last visit did NOT show Buprenorphine .   The following portions of the patient's history were reviewed and updated as appropriate: allergies, current medications, past family history, past medical history, past social history, past surgical history and problem list. Problem list updated.  Objective:   Vitals:   07/26/24 1427  BP: 125/84  Pulse: (!) 105    Fetal Status:     Movement: Present     General:  Alert, oriented and cooperative. Patient is in no acute distress.  Respiratory: Normal respiratory effort, no problems with respiration noted  Mental Status: Normal mood and affect. Normal behavior. Normal judgment and thought content.   Urinalysis:      PDMP not reviewed this  encounter.   Last UDS: Lab Results  Component Value Date   CREATIUR 34 07/19/2024     Assessment and Plan:  Pregnancy: G2P1001 at [redacted]w[redacted]d  1. Supervision of high risk pregnancy, antepartum (Primary)  2. Suboxone  maintenance treatment complicating pregnancy, antepartum (HCC) - Tox Screens c/f Suboxone  diversion. Dr. Lola and Nakeysha Pasqual  [name], CNM discussed long Hx ToxScreens not C/W prescribed Suboxone  and that unfortunately we cannot continue prescribing Suboxone  when ToxScreen do to match what se are prescribing. Emphasized the importance of prenatal care and that we want to continue caring for her, but we cannot continue prescribing her Suboxone  if it does not appear that she is the one using it.  - Recommend in-person visits going forward so that mom and baby can be fully assessed.   3. Cystic fibrosis carrier  4. Bipolar disorder in full remission, most recent episode unspecified type - Not discussed  Preterm labor symptoms and general obstetric precautions including but not limited to vaginal bleeding, contractions, leaking of fluid and fetal movement were reviewed in detail with the patient. Please refer to After Visit Summary for other counseling recommendations.   No follow-ups on file.   Future Appointments  Date Time Provider Department Center  07/28/2024 11:15 AM [facility] PROVIDER 1 [facility]  07/28/2024 11:30 AM [facility] US4 [facility]  08/02/2024  1:15 PM Lola Donnice HERO, MD [facility]  08/09/2024  1:35 PM Lola Donnice HERO, MD [facility]  08/16/2024  1:35 PM Lola Donnice HERO, MD [facility]   The patient was advised to call back or seek an in-person evaluation if the symptoms worsen or if the condition fails to improve as anticipated.  Total face-to-face time with  patient: 15 minutes.  Over 50% of encounter was spent on counseling and coordination of care.  Elvy Mclarty  [name] HOWARD 07/26/2024 2:40 PM Center for [employer], [facility]

## 2024-07-27 ENCOUNTER — Encounter: Admitting: Obstetrics and Gynecology

## 2024-07-28 ENCOUNTER — Ambulatory Visit

## 2024-07-28 ENCOUNTER — Ambulatory Visit: Attending: Obstetrics and Gynecology | Admitting: Obstetrics and Gynecology

## 2024-07-28 VITALS — BP 113/70 | HR 96

## 2024-07-28 DIAGNOSIS — O99333 Smoking (tobacco) complicating pregnancy, third trimester: Secondary | ICD-10-CM | POA: Diagnosis not present

## 2024-07-28 DIAGNOSIS — O99891 Other specified diseases and conditions complicating pregnancy: Secondary | ICD-10-CM | POA: Diagnosis not present

## 2024-07-28 DIAGNOSIS — Z148 Genetic carrier of other disease: Secondary | ICD-10-CM | POA: Diagnosis not present

## 2024-07-28 DIAGNOSIS — O0993 Supervision of high risk pregnancy, unspecified, third trimester: Secondary | ICD-10-CM

## 2024-07-28 DIAGNOSIS — O0990 Supervision of high risk pregnancy, unspecified, unspecified trimester: Secondary | ICD-10-CM | POA: Diagnosis not present

## 2024-07-28 DIAGNOSIS — Z141 Cystic fibrosis carrier: Secondary | ICD-10-CM | POA: Diagnosis not present

## 2024-07-28 DIAGNOSIS — F1290 Cannabis use, unspecified, uncomplicated: Secondary | ICD-10-CM | POA: Insufficient documentation

## 2024-07-28 DIAGNOSIS — Z3A37 37 weeks gestation of pregnancy: Secondary | ICD-10-CM | POA: Diagnosis not present

## 2024-07-28 DIAGNOSIS — O99323 Drug use complicating pregnancy, third trimester: Secondary | ICD-10-CM

## 2024-07-28 DIAGNOSIS — F17210 Nicotine dependence, cigarettes, uncomplicated: Secondary | ICD-10-CM | POA: Insufficient documentation

## 2024-07-28 DIAGNOSIS — F1120 Opioid dependence, uncomplicated: Secondary | ICD-10-CM | POA: Diagnosis present

## 2024-07-28 DIAGNOSIS — O99320 Drug use complicating pregnancy, unspecified trimester: Secondary | ICD-10-CM | POA: Diagnosis present

## 2024-07-28 DIAGNOSIS — Z362 Encounter for other antenatal screening follow-up: Secondary | ICD-10-CM | POA: Insufficient documentation

## 2024-07-28 NOTE — Progress Notes (Signed)
 Maternal-Fetal Medicine Consultation  Name: Jennifer Marshall  MRN: [id number]  GA: [id number] [redacted]w[redacted]d   Patient is here for fetal growth assessment.  She takes Suboxone  and reports no withdrawal symptoms. Patient reports she smokes cigarettes daily.  She does not have gestational diabetes.  Blood pressure today at our office is 113/70 mmHg.  Ultrasound Normal fetal growth and amniotic fluid.  Cephalic presentation. I reassured the patient of the findings.  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  Patient is aware of the possibility of NAS. I discussed the option of nicotine replacement therapy (NRT) that reduces quit rates.  Patient opted not to have NRT. She wanted to know the timing of delivery.  If cervix is favorable, patient can be delivered at [redacted] weeks gestation.  Recommendations - No follow-up appointments were made.     Consultation including face-to-face (more than 50%) counseling 20 minutes.

## 2024-08-02 ENCOUNTER — Other Ambulatory Visit: Payer: Self-pay

## 2024-08-02 ENCOUNTER — Telehealth: Admitting: Family Medicine

## 2024-08-02 ENCOUNTER — Ambulatory Visit (INDEPENDENT_AMBULATORY_CARE_PROVIDER_SITE_OTHER): Admitting: Advanced Practice Midwife

## 2024-08-02 ENCOUNTER — Ambulatory Visit: Payer: Self-pay | Admitting: Advanced Practice Midwife

## 2024-08-02 VITALS — BP 116/83 | HR 102 | Wt 212.6 lb

## 2024-08-02 DIAGNOSIS — F1120 Opioid dependence, uncomplicated: Secondary | ICD-10-CM

## 2024-08-02 DIAGNOSIS — O99333 Smoking (tobacco) complicating pregnancy, third trimester: Secondary | ICD-10-CM | POA: Diagnosis not present

## 2024-08-02 DIAGNOSIS — Q379 Unspecified cleft palate with unilateral cleft lip: Secondary | ICD-10-CM | POA: Diagnosis not present

## 2024-08-02 DIAGNOSIS — O99013 Anemia complicating pregnancy, third trimester: Secondary | ICD-10-CM

## 2024-08-02 DIAGNOSIS — O99330 Smoking (tobacco) complicating pregnancy, unspecified trimester: Secondary | ICD-10-CM | POA: Insufficient documentation

## 2024-08-02 DIAGNOSIS — O0993 Supervision of high risk pregnancy, unspecified, third trimester: Secondary | ICD-10-CM

## 2024-08-02 DIAGNOSIS — Z141 Cystic fibrosis carrier: Secondary | ICD-10-CM

## 2024-08-02 DIAGNOSIS — F3170 Bipolar disorder, currently in remission, most recent episode unspecified: Secondary | ICD-10-CM

## 2024-08-02 DIAGNOSIS — O0990 Supervision of high risk pregnancy, unspecified, unspecified trimester: Secondary | ICD-10-CM

## 2024-08-02 DIAGNOSIS — O99323 Drug use complicating pregnancy, third trimester: Secondary | ICD-10-CM

## 2024-08-02 DIAGNOSIS — Z3A38 38 weeks gestation of pregnancy: Secondary | ICD-10-CM

## 2024-08-02 MED ORDER — FERRIC MALTOL 30 MG PO CAPS: 30.0000 mg | ORAL_CAPSULE | ORAL | 2 refills | Status: DC

## 2024-08-02 NOTE — Progress Notes (Signed)
   Subjective:  Jennifer  Marea Marshall is a 23 y.o. G2P1001 at [redacted]w[redacted]d being seen today for ongoing prenatal care.  She is currently monitored for the following issues for this high-risk pregnancy and has Cleft lip and cleft palate-maternal; Supervision of high risk pregnancy, antepartum; Opioid use disorder in remission; Cystic fibrosis carrier; Nonimmune to hepatitis B virus; Marijuana use during pregnancy; Bipolar affective disorder (HCC); Suboxone  maintenance treatment complicating pregnancy, antepartum (HCC); Anemia in pregnancy; and Tobacco smoking affecting pregnancy, antepartum on their problem list.  Patient reports occasional contractions.  Contractions: Irritability. Vag. Bleeding: None.  Movement: Present. Denies leaking of fluid.   The following portions of the patient's history were reviewed and updated as appropriate: allergies, current medications, past family history, past medical history, past social history, past surgical history and problem list. Problem list updated.  Objective:   Vitals:   08/02/24 1340  BP: 116/83  Pulse: (!) 102  Weight: 212 lb 9.6 oz (96.4 kg)    Fetal Status: Fetal Heart Rate (bpm): 137 Fundal Height: 38 cm Movement: Present  Presentation: Vertex  General:  Alert, oriented and cooperative. Patient is in no acute distress.  Skin: Skin is warm and dry. No rash noted.   Cardiovascular: Normal heart rate noted  Respiratory: Normal respiratory effort, no problems with respiration noted  Abdomen: Soft, gravid, appropriate for gestational age. Pain/Pressure: Present     Pelvic: Vag. Bleeding: None     Cervical exam deferred        Extremities: Normal range of motion.  Edema: None  Mental Status: Normal mood and affect. Normal behavior. Normal judgment and thought content.   Urinalysis:      PDMP reviewed during this encounter.     Last UDS:     Assessment and Plan:  Pregnancy: G2P1001 at [redacted]w[redacted]d  1. Cleft lip and cleft palate-maternal  (Primary) - Had Genetic counseling  2. Tobacco smoking affecting pregnancy, antepartum - Offered NRT by MFM. Declined. Nml fetal growth.   3. Supervision of high risk pregnancy, antepartum  4. Suboxone  maintenance treatment complicating pregnancy, antepartum (HCC) - ToxAssure Flex 15, Ur - [facility] is not longer prescribing   5. Cystic fibrosis carrier - Had Genetic counseling  6. Bipolar disorder in full remission, most recent episode unspecified type - Stable mood on Lamictal   Term labor symptoms and general obstetric precautions including but not limited to vaginal bleeding, contractions, leaking of fluid and fetal movement were reviewed in detail with the patient. Please refer to After Visit Summary for other counseling recommendations.   Return in about 1 week (around 08/09/2024) for [facility].   Future Appointments  Date Time Provider Department Center  08/09/2024  1:35 PM Lola Donnice HERO, MD [facility]  08/16/2024  1:35 PM Lola Donnice HERO, MD [facility]     Jabar Krysiak  [name], CNM 08/02/2024 2:01 PM Center for [employer] Faculty Practice, [facility]

## 2024-08-02 NOTE — Addendum Note (Signed)
 Addended by: HONORE VERNELL BRAVO on: 08/02/2024 05:36 PM   Modules accepted: Orders

## 2024-08-03 ENCOUNTER — Encounter: Admitting: Family Medicine

## 2024-08-03 ENCOUNTER — Telehealth (HOSPITAL_COMMUNITY): Payer: Self-pay | Admitting: *Deleted

## 2024-08-03 NOTE — Telephone Encounter (Signed)
 Preadmission screen

## 2024-08-04 ENCOUNTER — Telehealth (HOSPITAL_COMMUNITY): Payer: Self-pay | Admitting: *Deleted

## 2024-08-04 ENCOUNTER — Encounter (HOSPITAL_COMMUNITY): Payer: Self-pay | Admitting: *Deleted

## 2024-08-04 NOTE — Telephone Encounter (Signed)
 Preadmission screen

## 2024-08-09 ENCOUNTER — Inpatient Hospital Stay (HOSPITAL_COMMUNITY)
Admission: RE | Admit: 2024-08-09 | Discharge: 2024-08-11 | DRG: 807 | Disposition: A | Discharge status: Home / Self Care | Attending: Obstetrics and Gynecology | Admitting: Obstetrics and Gynecology

## 2024-08-09 ENCOUNTER — Telehealth: Admitting: Family Medicine

## 2024-08-09 ENCOUNTER — Inpatient Hospital Stay (HOSPITAL_COMMUNITY)

## 2024-08-09 ENCOUNTER — Other Ambulatory Visit: Payer: Self-pay

## 2024-08-09 ENCOUNTER — Inpatient Hospital Stay (HOSPITAL_COMMUNITY): Admitting: Anesthesiology

## 2024-08-09 ENCOUNTER — Encounter (HOSPITAL_COMMUNITY): Payer: Self-pay | Admitting: Family Medicine

## 2024-08-09 DIAGNOSIS — O99334 Smoking (tobacco) complicating childbirth: Secondary | ICD-10-CM | POA: Diagnosis present

## 2024-08-09 DIAGNOSIS — O99019 Anemia complicating pregnancy, unspecified trimester: Secondary | ICD-10-CM | POA: Diagnosis present

## 2024-08-09 DIAGNOSIS — F1290 Cannabis use, unspecified, uncomplicated: Secondary | ICD-10-CM | POA: Diagnosis present

## 2024-08-09 DIAGNOSIS — O9902 Anemia complicating childbirth: Secondary | ICD-10-CM | POA: Diagnosis present

## 2024-08-09 DIAGNOSIS — Z79899 Other long term (current) drug therapy: Secondary | ICD-10-CM | POA: Diagnosis not present

## 2024-08-09 DIAGNOSIS — O99344 Other mental disorders complicating childbirth: Secondary | ICD-10-CM | POA: Diagnosis present

## 2024-08-09 DIAGNOSIS — Z141 Cystic fibrosis carrier: Secondary | ICD-10-CM

## 2024-08-09 DIAGNOSIS — F1120 Opioid dependence, uncomplicated: Secondary | ICD-10-CM

## 2024-08-09 DIAGNOSIS — Q379 Unspecified cleft palate with unilateral cleft lip: Secondary | ICD-10-CM

## 2024-08-09 DIAGNOSIS — O0990 Supervision of high risk pregnancy, unspecified, unspecified trimester: Principal | ICD-10-CM

## 2024-08-09 DIAGNOSIS — F17210 Nicotine dependence, cigarettes, uncomplicated: Secondary | ICD-10-CM | POA: Diagnosis present

## 2024-08-09 DIAGNOSIS — F319 Bipolar disorder, unspecified: Secondary | ICD-10-CM | POA: Diagnosis present

## 2024-08-09 DIAGNOSIS — F1191 Opioid use, unspecified, in remission: Secondary | ICD-10-CM | POA: Diagnosis present

## 2024-08-09 DIAGNOSIS — F1190 Opioid use, unspecified, uncomplicated: Secondary | ICD-10-CM | POA: Diagnosis present

## 2024-08-09 DIAGNOSIS — O99324 Drug use complicating childbirth: Secondary | ICD-10-CM | POA: Diagnosis present

## 2024-08-09 DIAGNOSIS — O26893 Other specified pregnancy related conditions, third trimester: Secondary | ICD-10-CM | POA: Diagnosis present

## 2024-08-09 DIAGNOSIS — Z3A39 39 weeks gestation of pregnancy: Secondary | ICD-10-CM | POA: Diagnosis not present

## 2024-08-09 DIAGNOSIS — F1111 Opioid abuse, in remission: Secondary | ICD-10-CM | POA: Diagnosis present

## 2024-08-09 DIAGNOSIS — Z789 Other specified health status: Secondary | ICD-10-CM | POA: Diagnosis present

## 2024-08-09 DIAGNOSIS — O99320 Drug use complicating pregnancy, unspecified trimester: Secondary | ICD-10-CM | POA: Diagnosis present

## 2024-08-09 DIAGNOSIS — O99330 Smoking (tobacco) complicating pregnancy, unspecified trimester: Secondary | ICD-10-CM | POA: Diagnosis present

## 2024-08-09 LAB — COMPREHENSIVE METABOLIC PANEL WITH GFR
ALT: 12 U/L (ref 0–44)
AST: 19 U/L (ref 15–41)
Albumin: 2.4 g/dL — ABNORMAL LOW (ref 3.5–5.0)
Alkaline Phosphatase: 138 U/L — ABNORMAL HIGH (ref 38–126)
Anion gap: 8 (ref 5–15)
BUN: 5 mg/dL — ABNORMAL LOW (ref 6–20)
CO2: 19 mmol/L — ABNORMAL LOW (ref 22–32)
Calcium: 8.7 mg/dL — ABNORMAL LOW (ref 8.9–10.3)
Chloride: 106 mmol/L (ref 98–111)
Creatinine, Ser: 0.59 mg/dL (ref 0.44–1.00)
GFR, Estimated: >60 mL/min (ref >60)
Glucose, Bld: 94 mg/dL (ref 70–99)
Potassium: 3.6 mmol/L (ref 3.5–5.1)
Sodium: 133 mmol/L — ABNORMAL LOW (ref 135–145)
Total Bilirubin: 0.4 mg/dL (ref 0.0–1.2)
Total Protein: 5.7 g/dL — ABNORMAL LOW (ref 6.5–8.1)

## 2024-08-09 LAB — RPR: RPR Ser Ql: NONREACTIVE

## 2024-08-09 LAB — RAPID URINE DRUG SCREEN, HOSP PERFORMED
Amphetamines: NOT DETECTED
Barbiturates: NOT DETECTED
Benzodiazepines: NOT DETECTED
Cocaine: NOT DETECTED
Opiates: NOT DETECTED
Tetrahydrocannabinol: POSITIVE — AB

## 2024-08-09 LAB — CBC
HCT: 29 % — ABNORMAL LOW (ref 36.0–46.0)
Hemoglobin: 9.9 g/dL — ABNORMAL LOW (ref 12.0–15.0)
MCH: 30.7 pg (ref 26.0–34.0)
MCHC: 34.1 g/dL (ref 30.0–36.0)
MCV: 90.1 fL (ref 80.0–100.0)
Platelets: 307 K/uL (ref 150–400)
RBC: 3.22 MIL/uL — ABNORMAL LOW (ref 3.87–5.11)
RDW: 12.5 % (ref 11.5–15.5)
WBC: 14.2 K/uL — ABNORMAL HIGH (ref 4.0–10.5)
nRBC: 0 % (ref 0.0–0.2)

## 2024-08-09 LAB — TYPE AND SCREEN
ABO/RH(D): O POS
Antibody Screen: NEGATIVE

## 2024-08-09 MED ORDER — LAMOTRIGINE 100 MG PO TABS
100.0000 mg | ORAL_TABLET | Freq: Every day | ORAL | Status: DC
Start: 2024-08-09 — End: 2024-08-09
  Administered 2024-08-09: 100 mg via ORAL
  Filled 2024-08-09: qty 1

## 2024-08-09 MED ORDER — LIDOCAINE HCL (PF) 1 % IJ SOLN
INTRAMUSCULAR | Status: DC | PRN
  Administered 2024-08-09: 5 mL via EPIDURAL

## 2024-08-09 MED ORDER — PRENATAL MULTIVITAMIN CH
1.0000 | ORAL_TABLET | Freq: Every day | ORAL | Status: DC
  Administered 2024-08-10 – 2024-08-11 (×2): 1 via ORAL
  Filled 2024-08-09 (×2): qty 1

## 2024-08-09 MED ORDER — PHENYLEPHRINE 80 MCG/ML (10ML) SYRINGE FOR IV PUSH (FOR BLOOD PRESSURE SUPPORT): 80.0000 ug | PREFILLED_SYRINGE | INTRAVENOUS | Status: DC | PRN

## 2024-08-09 MED ORDER — SIMETHICONE 80 MG PO CHEW: 80.0000 mg | CHEWABLE_TABLET | ORAL | Status: DC | PRN

## 2024-08-09 MED ORDER — WITCH HAZEL-GLYCERIN EX PADS: 1.0000 | MEDICATED_PAD | CUTANEOUS | Status: DC | PRN

## 2024-08-09 MED ORDER — ACETAMINOPHEN 325 MG PO TABS
650.0000 mg | ORAL_TABLET | ORAL | Status: DC | PRN
  Administered 2024-08-09: 650 mg via ORAL
  Filled 2024-08-09: qty 2

## 2024-08-09 MED ORDER — IBUPROFEN 600 MG PO TABS
600.0000 mg | ORAL_TABLET | Freq: Four times a day (QID) | ORAL | Status: DC
  Administered 2024-08-09 – 2024-08-11 (×7): 600 mg via ORAL
  Filled 2024-08-09 (×8): qty 1

## 2024-08-09 MED ORDER — OXYTOCIN-SODIUM CHLORIDE 30-0.9 UT/500ML-% IV SOLN: 1.0000 m[IU]/min | INTRAVENOUS | Status: DC

## 2024-08-09 MED ORDER — FENTANYL-BUPIVACAINE-NACL 0.5-0.125-0.9 MG/250ML-% EP SOLN
12.0000 mL/h | EPIDURAL | Status: DC | PRN
  Administered 2024-08-09: 12 mL/h via EPIDURAL
  Filled 2024-08-09: qty 250

## 2024-08-09 MED ORDER — SOD CITRATE-CITRIC ACID 500-334 MG/5ML PO SOLN: 30.0000 mL | ORAL | Status: DC | PRN

## 2024-08-09 MED ORDER — BENZOCAINE-MENTHOL 20-0.5 % EX AERO: 1.0000 | INHALATION_SPRAY | CUTANEOUS | Status: DC | PRN

## 2024-08-09 MED ORDER — DIBUCAINE (PERIANAL) 1 % EX OINT: 1.0000 | TOPICAL_OINTMENT | CUTANEOUS | Status: DC | PRN

## 2024-08-09 MED ORDER — LACTATED RINGERS IV SOLN
500.0000 mL | Freq: Once | INTRAVENOUS | Status: AC
  Administered 2024-08-09: 500 mL via INTRAVENOUS

## 2024-08-09 MED ORDER — FENTANYL CITRATE (PF) 100 MCG/2ML IJ SOLN
50.0000 ug | INTRAMUSCULAR | Status: DC | PRN
  Administered 2024-08-09: 50 ug via INTRAVENOUS
  Administered 2024-08-09: 100 ug via INTRAVENOUS
  Filled 2024-08-09 (×2): qty 2

## 2024-08-09 MED ORDER — TETANUS-DIPHTH-ACELL PERTUSSIS 5-2-15.5 LF-MCG/0.5 IM SUSP: 0.5000 mL | Freq: Once | INTRAMUSCULAR | Status: DC

## 2024-08-09 MED ORDER — OXYTOCIN-SODIUM CHLORIDE 30-0.9 UT/500ML-% IV SOLN
2.5000 [IU]/h | INTRAVENOUS | Status: DC
  Filled 2024-08-09: qty 500

## 2024-08-09 MED ORDER — ONDANSETRON HCL 4 MG/2ML IJ SOLN: 4.0000 mg | INTRAMUSCULAR | Status: DC | PRN

## 2024-08-09 MED ORDER — ACETAMINOPHEN 325 MG PO TABS
650.0000 mg | ORAL_TABLET | ORAL | Status: DC | PRN
  Administered 2024-08-10 – 2024-08-11 (×2): 650 mg via ORAL
  Filled 2024-08-09 (×2): qty 2

## 2024-08-09 MED ORDER — SENNOSIDES-DOCUSATE SODIUM 8.6-50 MG PO TABS
2.0000 | ORAL_TABLET | Freq: Every day | ORAL | Status: DC
  Administered 2024-08-10 – 2024-08-11 (×2): 2 via ORAL
  Filled 2024-08-09 (×2): qty 2

## 2024-08-09 MED ORDER — TERBUTALINE SULFATE 1 MG/ML IJ SOLN: 0.2500 mg | Freq: Once | INTRAMUSCULAR | Status: DC | PRN

## 2024-08-09 MED ORDER — OXYTOCIN-SODIUM CHLORIDE 30-0.9 UT/500ML-% IV SOLN
1.0000 m[IU]/min | INTRAVENOUS | Status: DC
  Administered 2024-08-09: 2 m[IU]/min via INTRAVENOUS

## 2024-08-09 MED ORDER — OXYCODONE-ACETAMINOPHEN 5-325 MG PO TABS: 1.0000 | ORAL_TABLET | ORAL | Status: DC | PRN

## 2024-08-09 MED ORDER — LIDOCAINE HCL (PF) 1 % IJ SOLN: 30.0000 mL | INTRAMUSCULAR | Status: DC | PRN

## 2024-08-09 MED ORDER — EPHEDRINE 5 MG/ML INJ: 10.0000 mg | INTRAVENOUS | Status: DC | PRN

## 2024-08-09 MED ORDER — DIPHENHYDRAMINE HCL 50 MG/ML IJ SOLN: 12.5000 mg | INTRAMUSCULAR | Status: DC | PRN

## 2024-08-09 MED ORDER — ZOLPIDEM TARTRATE 5 MG PO TABS: 5.0000 mg | ORAL_TABLET | Freq: Every evening | ORAL | Status: DC | PRN

## 2024-08-09 MED ORDER — MISOPROSTOL 25 MCG QUARTER TABLET
25.0000 ug | ORAL_TABLET | ORAL | Status: DC
  Administered 2024-08-09: 25 ug via VAGINAL
  Filled 2024-08-09: qty 1

## 2024-08-09 MED ORDER — ONDANSETRON HCL 4 MG PO TABS: 4.0000 mg | ORAL_TABLET | ORAL | Status: DC | PRN

## 2024-08-09 MED ORDER — LACTATED RINGERS IV SOLN: 500.0000 mL | INTRAVENOUS | Status: DC | PRN

## 2024-08-09 MED ORDER — OXYCODONE-ACETAMINOPHEN 5-325 MG PO TABS: 2.0000 | ORAL_TABLET | ORAL | Status: DC | PRN

## 2024-08-09 MED ORDER — ONDANSETRON HCL 4 MG/2ML IJ SOLN: 4.0000 mg | Freq: Four times a day (QID) | INTRAMUSCULAR | Status: DC | PRN

## 2024-08-09 MED ORDER — OXYTOCIN BOLUS FROM INFUSION
333.0000 mL | Freq: Once | INTRAVENOUS | Status: AC
  Administered 2024-08-09: 333 mL via INTRAVENOUS

## 2024-08-09 MED ORDER — DIPHENHYDRAMINE HCL 25 MG PO CAPS: 25.0000 mg | ORAL_CAPSULE | Freq: Four times a day (QID) | ORAL | Status: DC | PRN

## 2024-08-09 MED ORDER — FLEET ENEMA RE ENEM: 1.0000 | ENEMA | RECTAL | Status: DC | PRN

## 2024-08-09 MED ORDER — MISOPROSTOL 50MCG HALF TABLET
50.0000 ug | ORAL_TABLET | Freq: Once | ORAL | Status: AC
  Administered 2024-08-09: 50 ug via BUCCAL
  Filled 2024-08-09: qty 1

## 2024-08-09 MED ORDER — LACTATED RINGERS IV SOLN: INTRAVENOUS | Status: DC

## 2024-08-09 MED ORDER — COCONUT OIL OIL: 1.0000 | TOPICAL_OIL | Status: DC | PRN

## 2024-08-09 NOTE — Progress Notes (Addendum)
 Labor Progress Note Jennifer Marshall  Jennifer Marshall is a 23 y.o. G2P1001 at [redacted]w[redacted]d presented for IOL.   S:  Patient doing well, required IV fentanyl for pain control.   O:  BP 108/63   Pulse 85   Temp 98.1 F (36.7 C) (Oral)   Resp 18   Ht 5' 7 (1.702 m)   Wt 101.6 kg   LMP 11/04/2023 (Within Days)   SpO2 98%   BMI 35.07 kg/m  EFM: 120 bpm/mod var/+accel/-decel Toco: q2-3  CVE: Dilation: 3 Effacement (%): 40 Cervical Position: Posterior Station: -2 Presentation: Vertex Exam by:: Jobin Montelongo, MD   A&P: 23 y.o. G2P1001 [redacted]w[redacted]d who presented for IOL  #Labor: start pit #Pain: IV fentanyl, epidural PRN #FWB: CAT I    [name], MD 3:18 PM   Attestation of Supervision of Resident: Evaluation and management procedures were performed by the learners: Family Medicine Resident under my supervision. I was immediately available for direct supervision, assistance and direction throughout this encounter.  I also confirm that I have verified the information documented in the resident's note, and that I have also personally reperformed the pertinent components of the physical exam and all of the medical decision making activities.  I have also made any necessary editorial changes.  Barabara Maier, DO FMOB Fellow, Faculty Practice [facility], Center for [employer]

## 2024-08-09 NOTE — H&P (Addendum)
 OBSTETRIC ADMISSION HISTORY AND PHYSICAL  Jennifer  Julyanna Marshall is a 23 y.o. female G2P1001 with IUP at [redacted]w[redacted]d by LMP presenting for eIOL. She reports +FMs, No LOF, no VB, no blurry vision, or peripheral edema, or RUQ pain. She had a headache that was resolved with acetaminophen .   She received her prenatal [facility] at [facility]   Dating: By 1st trimester US  at [redacted]w[redacted]d --->  Estimated Date of Delivery: 08/17/24  Sono @[redacted]w[redacted]d , CWD, normal anatomy, cephalic presentation, posterior  placenta, 3187g, 63% EFW   Prenatal History/Complications:        NURSING  PROVIDER  Conservator, [facility] [employer] Dating by LMP c/w 10 wk US   [facility] Model Traditional Anatomy U/S Normal, f/w MFM  Initiated care at  [facility]                 Language  English               LAB RESULTS   Support Person Jethro Louder (friend) Genetics NIPS: low risk, female AFP: normal      NT/IT (FT only)        Carrier Screen Horizon: CF carrier Energy manager given  [ ] Result  Rhogam  O/Positive/-- (04/01 1014) A1C/GTT Early HgbA1C: normal Third trimester 2 hr GTT: neg  Flu Vaccine Declined      TDaP Vaccine 07/14/24 Blood Type O/Positive/-- (04/01 1014)  RSV Vaccine 07/14/24 Antibody Negative (04/01 1014)  COVID Vaccine   Rubella 1.11 (04/01 1014)  Feeding Plan breast RPR Non Reactive (07/28 0859)  Contraception Depo-Provera HBsAg Negative (04/01 1014)  Circumcision NA (female) HIV Non Reactive (07/28 0859)  Pediatrician   [facility] HCVAb Non Reactive (04/01 1014)  Prenatal Classes        BTL Consent NA Pap No results found for: DIAGPAP  BTL Pre-payment   GC/CT Initial:  neg/neg 36wks:    VBAC Consent NA GBS For PCN allergy, check sensitivities   BRx Optimized? [ ]  yes   [ ]  no      DME Rx [ ]  BP cuff [ ]  Weight Scale Waterbirth  [ ]  Class [ ]  Consent [ ]  CNM visit  PHQ9 & GAD7 [  ] new OB [  ] 28 weeks  [  ] 36 weeks Induction  [ ]  Orders Entered [ ] Foley Y/N      Past Medical History: Past Medical History:   Diagnosis Date   Bipolar affective disorder (HCC) 04/19/2024   Cleft lip and cleft palate-maternal 05/26/2018   [X]  Genetic counseling     Opioid use disorder in remission 01/19/2024    Past Surgical History: Past Surgical History:  Procedure Laterality Date   CLEFT PALATE REPAIR      Obstetrical History: OB History  Gravida Para Term Preterm AB Living  2 1 1  0 0 1  SAB IAB Ectopic Multiple Live Births  0 0 0  1    # Outcome Date GA Lbr Len/2nd Weight Sex Type Anes PTL Lv  2 Current           1 Term 10/19/18 [redacted]w[redacted]d / 00:39 3340 g F Vag-Spont EPI N LIV    Social History Social History   Socioeconomic History   Marital status: Single    Spouse name: Not on file   Number of children: Not on file   Years of education: Not on file   Highest education level: Not on file  Occupational History   Not on file  Tobacco Use  Smoking status: Every Day    Current packs/day: 0.50    Types: Cigarettes   Smokeless tobacco: Never  Vaping Use   Vaping status: Former   Substances: Nicotine, CBD  Substance and Sexual Activity   Alcohol use: No   Drug use: No    Comment: SUBOXONE    Sexual activity: Not Currently  Other Topics Concern   Not on file  Social History Narrative   ** Merged History Encounter **       Social Drivers of Health   Financial Resource Strain: Low Risk  (12/04/2022)   Overall Financial Resource Strain (CARDIA)    Difficulty of Paying Living Expenses: Not very hard  Food Insecurity: No Food Insecurity (08/09/2024)   Hunger Vital Sign    Worried About Running Out of Food in the Last Year: Never true    Ran Out of Food in the Last Year: Never true  Transportation Needs: No Transportation Needs (08/09/2024)   PRAPARE - Administrator, Civil Service (Medical): No    Lack of Transportation (Non-Medical): No  Physical Activity: Inactive (12/04/2022)   Exercise Vital Sign    Days of Exercise per Week: 0 days    Minutes of Exercise per Session:  0 min  Stress: No Stress Concern Present (12/04/2022)   [employer] of Occupational Health - Occupational Stress Questionnaire    Feeling of Stress : Not at all  Social Connections: Socially Isolated (12/04/2022)   Social Connection and Isolation Panel    Frequency of Communication with Friends and Family: More than three times a week    Frequency of Social Gatherings with Friends and Family: Once a week    Attends Religious Services: Never    Database administrator or Organizations: No    Attends Engineer, structural: Never    Marital Status: Never married    Family History: Family History  Problem Relation Age of Onset   Atrial fibrillation Mother    Bipolar disorder Mother    ADD / ADHD Brother     Allergies: No Known Allergies  Medications Prior to Admission  Medication Sig Dispense Refill Last Dose/Taking   Ferric Maltol  30 MG CAPS Take 1 capsule (30 mg total) by mouth every other day. 17 capsule 2 08/07/2024   lamoTRIgine  (LAMICTAL ) 100 MG tablet Take 1 tablet (100 mg total) by mouth daily. 30 tablet 1 08/08/2024   Prenat-Fe Poly-Methfol-FA-DHA (VITAFOL  ULTRA) 29-0.6-0.4-200 MG CAPS Take 1 capsule by mouth daily. 30 capsule 11 08/08/2024   naloxone  (NARCAN ) nasal spray 4 mg/0.1 mL Use as needed to reverse opioid overdose (Patient not taking: Reported on 08/02/2024) 2 each 11    ondansetron  (ZOFRAN ) 4 MG tablet Take 1 tablet (4 mg total) by mouth 4 (four) times daily as needed for nausea or vomiting. (Patient not taking: Reported on 08/02/2024) 30 tablet 2      Review of Systems   All systems reviewed and negative except as stated in HPI  Blood pressure 115/75, pulse 84, temperature 98.4 F (36.9 C), temperature source Oral, resp. rate 17, height 5' 7 (1.702 m), weight 101.6 kg, last menstrual period 11/04/2023. General appearance: alert, cooperative, and appears stated age Lungs: No distress on RA Heart: regular rate and rhythm Abdomen: soft,  non-tender; bowel sounds normal   Presentation: cephalic Fetal monitoringBaseline: 120 bpm, Variability: Good {> 6 bpm), Accelerations: Reactive, and Decelerations: Absent Uterine activityFrequency: Every 3 minutes Dilation: Fingertip Effacement (%): Thick Station: -3 Exam by:: Vernell Gentry RN  Prenatal labs: ABO, Rh: --/--/O POS (10/21 0701) Antibody: NEG (10/21 0701) Rubella: 1.11 (04/01 1014) RPR: NON REACTIVE (10/21 0702)  HBsAg: Negative (04/01 1014)  HIV: Non Reactive (07/28 0859)  GBS: Negative/-- (10/01 1015)    Lab Results  Component Value Date   GBS Negative 07/20/2024     Immunization History  Administered Date(s) Administered    sv, Bivalent, Protein Subunit Rsvpref,pf Marlow) 07/19/2024   Tdap 07/19/2024   Anatomy @21 +0:  The anatomic structures that were well seen appear normal without evidence of soft markers  Prenatal Transfer Tool  Maternal Diabetes: No Genetic Screening: Normal, mom is CF carrier Maternal Ultrasounds/Referrals: Normal Fetal Ultrasounds or other Referrals:  None Maternal Substance Abuse:  Yes:  Type: Smoker, Other: Suboxone , MJ Significant Maternal Medications:  Meds include: Other: Suboxone   Significant Maternal Lab Results: Group B Strep negative Number of Prenatal Visits:greater than 3 verified prenatal visits Maternal Vaccinations:RSV: Given during pregnancy >/=14 days ago and TDap 07/19/24 Other Comments:  None   Results for orders placed or performed during the hospital encounter of 08/09/24 (from the past 24 hours)  Type and screen   Collection Time: 08/09/24  7:01 AM  Result Value Ref Range   ABO/RH(D) O POS    Antibody Screen NEG    Sample Expiration      08/12/2024,2359 Performed at [facility], [address] [place], [place] [address]   CBC   Collection Time: 08/09/24  7:02 AM  Result Value Ref Range   WBC 14.2 (H) 4.0 - 10.5 K/uL   RBC 3.22 (L) 3.87 - 5.11 MIL/uL   Hemoglobin 9.9 (L) 12.0 - 15.0 g/dL    HCT 70.9 (L) 63.9 - 46.0 %   MCV 90.1 80.0 - 100.0 fL   MCH 30.7 26.0 - 34.0 pg   MCHC 34.1 30.0 - 36.0 g/dL   RDW 87.4 88.4 - 84.4 %   Platelets 307 150 - 400 K/uL   nRBC 0.0 0.0 - 0.2 %  Comprehensive metabolic panel   Collection Time: 08/09/24  7:02 AM  Result Value Ref Range   Sodium 133 (L) 135 - 145 mmol/L   Potassium 3.6 3.5 - 5.1 mmol/L   Chloride 106 98 - 111 mmol/L   CO2 19 (L) 22 - 32 mmol/L   Glucose, Bld 94 70 - 99 mg/dL   BUN 5 (L) 6 - 20 mg/dL   Creatinine, Ser 9.40 0.44 - 1.00 mg/dL   Calcium 8.7 (L) 8.9 - 10.3 mg/dL   Total Protein 5.7 (L) 6.5 - 8.1 g/dL   Albumin 2.4 (L) 3.5 - 5.0 g/dL   AST 19 15 - 41 U/L   ALT 12 0 - 44 U/L   Alkaline Phosphatase 138 (H) 38 - 126 U/L   Total Bilirubin 0.4 0.0 - 1.2 mg/dL   GFR, Estimated >39 >39 mL/min   Anion gap 8 5 - 15  RPR   Collection Time: 08/09/24  7:02 AM  Result Value Ref Range   RPR Ser Ql NON REACTIVE NON REACTIVE  Urine rapid drug screen (hosp performed) not at [facility]   Collection Time: 08/09/24  7:18 AM  Result Value Ref Range   Opiates NONE DETECTED NONE DETECTED   Cocaine NONE DETECTED NONE DETECTED   Benzodiazepines NONE DETECTED NONE DETECTED   Amphetamines NONE DETECTED NONE DETECTED   Tetrahydrocannabinol POSITIVE (A) NONE DETECTED   Barbiturates NONE DETECTED NONE DETECTED    Patient Active Problem List   Diagnosis Date Noted  Opioid use disorder 08/09/2024   Tobacco smoking affecting pregnancy, antepartum 08/02/2024   Anemia in pregnancy 05/17/2024   Suboxone  maintenance treatment complicating pregnancy, antepartum (HCC) 05/08/2024   Bipolar affective disorder (HCC) 04/19/2024   Marijuana use during pregnancy 03/25/2024   Nonimmune to hepatitis B virus 03/01/2024   Cystic fibrosis carrier 01/27/2024   Supervision of high risk pregnancy, antepartum 01/19/2024   Opioid use disorder in remission 01/19/2024   Cleft lip and cleft palate-maternal 05/26/2018    Assessment/Plan:  Jennifer Marshall   Jennifer Marshall is a 23 y.o. G2P1001 at [redacted]w[redacted]d here for eOL.  She was on suboxone  throughout pregnancy until 2 months ago. She has smoked 1/2 pack a day throughout pregnancy. She is a CF carrier and has history of cleft lip, but no abnormalities were found on genetic screening or on ultrasound.   #Labor: IOL dual cytotec, fingertip exam at 07:40 #Pain: Per patient request #FWB: Category 1 #GBS status:  negative #Feeding: Breastmilk  #Reproductive Life planning: Depo Provera #Circ:  not applicable  #OUD in remission #tobacco use in pregnancy #marijuana use in pregnancy Established with [facility] clinic; stopped taking Suboxone  55mo ago. No current rx for Subxone.  - Was given verbal consent to obtain UDS; ToxAssure sent - down to 1/2 ppd cigarettes, declines NRT  #Anemia in Pregnancy Hgb on admit 9.9 - reports not taking iron  #CF Carrier - partner testing ordered, results not found in media tab  #Bipolar affective disorder - continue Lamictal   Jennifer Aline, MD 08/09/2024, 11:50 AM  Attestation of Supervision of Resident: Evaluation and management procedures were performed by the learners: Family Medicine Resident under my supervision. I was immediately available for direct supervision, assistance and direction throughout this encounter.  I also confirm that I have verified the information documented in the resident's note, and that I have also personally reperformed the pertinent components of the physical exam and all of the medical decision making activities.  I have also made any necessary editorial changes.  Barabara Maier, DO FMOB Fellow, Faculty Practice [facility], Center for [employer]

## 2024-08-09 NOTE — Anesthesia Preprocedure Evaluation (Signed)
 Anesthesia Evaluation  Patient identified by MRN, date of birth, ID band Patient awake    Reviewed: Allergy & Precautions, NPO status , Patient's Chart, lab work & pertinent test results  Airway Mallampati: II  TM Distance: >3 FB Neck ROM: Full    Dental no notable dental hx. (+) Teeth Intact   Pulmonary Current Smoker   Pulmonary exam normal breath sounds clear to auscultation       Cardiovascular negative cardio ROS Normal cardiovascular exam Rhythm:Regular Rate:Normal     Neuro/Psych  PSYCHIATRIC DISORDERS   Bipolar Disorder      GI/Hepatic negative GI ROS, Neg liver ROS,,,  Endo/Other  negative endocrine ROS    Renal/GU negative Renal ROS     Musculoskeletal   Abdominal   Peds  Hematology Lab Results      Component                Value               Date                      WBC                      14.2 (H)            08/09/2024                HGB                      9.9 (L)             08/09/2024                HCT                      29.0 (L)            08/09/2024                MCV                      90.1                08/09/2024                PLT                      307                 08/09/2024              Anesthesia Other Findings   Reproductive/Obstetrics (+) Pregnancy                              Anesthesia Physical Anesthesia Plan  ASA: 2  Anesthesia Plan: Epidural   Post-op Pain Management:    Induction:   PONV Risk Score and Plan:   Airway Management Planned:   Additional Equipment:   Intra-op Plan:   Post-operative Plan:   Informed Consent: I have reviewed the patients History and Physical, chart, labs and discussed the procedure including the risks, benefits and alternatives for the proposed anesthesia with the patient or authorized representative who has indicated his/her understanding and acceptance.       Plan Discussed with:    Anesthesia Plan Comments: (39 Wk G2P1 for LEA)  Anesthesia Quick Evaluation

## 2024-08-09 NOTE — Anesthesia Procedure Notes (Signed)
 Epidural Patient location during procedure: OB Start time: 08/09/2024 4:37 PM End time: 08/09/2024 4:47 PM  Staffing Anesthesiologist: Jefm Garnette LABOR, MD Performed: anesthesiologist   Preanesthetic Checklist Completed: patient identified, IV checked, site marked, risks and benefits discussed, surgical consent, monitors and equipment checked, pre-op evaluation and timeout performed  Epidural Patient position: sitting Prep: DuraPrep and site prepped and draped Patient monitoring: continuous pulse ox and blood pressure Approach: midline Location: L3-L4 Injection technique: LOR air  Needle:  Needle type: Tuohy  Needle gauge: 17 G Needle length: 9 cm and 9 Needle insertion depth: 7 cm Catheter type: closed end flexible Catheter size: 19 Gauge Catheter at skin depth: 13 cm Test dose: negative  Assessment Events: blood not aspirated, no cerebrospinal fluid, injection not painful, no injection resistance, no paresthesia and negative IV test  Additional Notes Patient identified. Risks/Benefits/Options discussed with patient including but not limited to bleeding, infection, nerve damage, paralysis, failed block, incomplete pain control, headache, blood pressure changes, nausea, vomiting, reactions to medication both or allergic, itching and postpartum back pain. Confirmed with bedside nurse the patient's most recent platelet count. Confirmed with patient that they are not currently taking any anticoagulation, have any bleeding history or any family history of bleeding disorders. Patient expressed understanding and wished to proceed. All questions were answered. Sterile technique was used throughout the entire procedure. Please see nursing notes for vital signs. Test dose was given through epidural needle and negative prior to continuing to dose epidural or start infusion. Warning signs of high block given to the patient including shortness of breath, tingling/numbness in hands, complete  motor block, or any concerning symptoms with instructions to call for help. Patient was given instructions on fall risk and not to get out of bed. All questions and concerns addressed with instructions to call with any issues.  1 Attempt (S) . Patient tolerated procedure well.

## 2024-08-09 NOTE — Progress Notes (Addendum)
 Labor Progress Note Jennifer  Radonna Marshall is a 23 y.o. G2P1001 at [redacted]w[redacted]d presented for IOL. S:   O:  BP 104/71   Pulse 96   Temp 98 F (36.7 C) (Oral)   Resp 18   Ht 5' 7 (1.702 m)   Wt 101.6 kg   LMP 11/04/2023 (Within Days)   SpO2 100%   BMI 35.07 kg/m  EFM: 140/mod variability/no accel/no decel  Dilation: 5 Effacement (%): 70 Cervical Position: Middle Station: -1 Presentation: Vertex Exam by:: Landis Cassaro MD  AROM copious cf  A&P: 23 y.o. G2P1001 [redacted]w[redacted]d admitted for IOL.  #Labor: continue pitocin titration, tachysystole without fetal HR changes, ok to 1/2 dose if FHR indicates. Expect labor to progress more rapidly  #Pain: Epidural in place.  #FWB: CAT I  [name], MD 5:57 PM    Attestation of Supervision of Resident: Evaluation and management procedures were performed by the learners: Family Medicine Resident under my supervision. I was immediately available for direct supervision, assistance and direction throughout this encounter.  I also confirm that I have verified the information documented in the resident's note, and that I have also personally reperformed the pertinent components of the physical exam and all of the medical decision making activities.  I have also made any necessary editorial changes.  Barabara Maier, DO FMOB Fellow, Faculty Practice [facility], Center for [employer]

## 2024-08-09 NOTE — Discharge Summary (Signed)
 Postpartum Discharge Summary  Date of Service updated***     Patient Name: Jennifer Marshall DOB: 10/28/2000 MRN: [id number]  Date of admission: 08/09/2024 Delivery date:08/09/2024 Delivering provider: MILLY PLANAS A Date of discharge: 08/09/2024  Admitting diagnosis: Opioid use disorder [F11.90] Intrauterine pregnancy: [redacted]w[redacted]d     Secondary diagnosis:  Principal Problem:   Opioid use disorder Active Problems:   Cleft lip and cleft palate-maternal   Opioid use disorder in remission   Cystic fibrosis carrier   Nonimmune to hepatitis B virus   Marijuana use during pregnancy   Bipolar affective disorder (HCC)   Anemia in pregnancy   Tobacco smoking affecting pregnancy, antepartum   SVD (spontaneous vaginal delivery)  Additional problems: ***    Discharge diagnosis: Term Pregnancy Delivered                                              Post partum procedures:{Postpartum procedures:23558} Augmentation: AROM, Pitocin, and Cytotec Complications: None  Hospital course: Induction of Labor With Vaginal Delivery   23 y.o. yo G2P1001 at [redacted]w[redacted]d was admitted to the hospital 08/09/2024 for induction of labor.  Indication for induction: Elective and OUD.  Patient had an uncomplicated labor course. Membrane Rupture Time/Date: 5:27 PM,08/09/2024  Delivery Method:Vaginal, Spontaneous Operative Delivery:N/A Episiotomy: None Lacerations:  Labial Details of delivery can be found in separate delivery note.  Patient had a postpartum course complicated by***. Patient is discharged home 08/09/24.  Newborn Data: Birth date:08/09/2024 Birth time:8:29 PM Gender:Female Living status:Living Apgars:7 ,9  Weight:   Magnesium Sulfate received: No BMZ received: No Rhophylac:No MMR:No T-DaP:Given prenatally Flu: Yes RSV Vaccine received: Yes Transfusion:No  Immunizations received: Immunization History  Administered Date(s) Administered    sv, Bivalent, Protein Subunit  Rsvpref,pf Marlow) 07/19/2024   Tdap 07/19/2024    Physical exam  Vitals:   08/09/24 2105 08/09/24 2110 08/09/24 2115 08/09/24 2118  BP:    132/75  Pulse:    84  Resp:      Temp:      TempSrc:      SpO2: 99% 99% 99%   Weight:      Height:       General: {Exam; general:21111117} Lochia: {Desc; appropriate/inappropriate:30686::appropriate} Uterine Fundus: {Desc; firm/soft:30687} Incision: {Exam; incision:21111123} DVT Evaluation: {Exam; dvt:2111122} Labs: Lab Results  Component Value Date   WBC 14.2 (H) 08/09/2024   HGB 9.9 (L) 08/09/2024   HCT 29.0 (L) 08/09/2024   MCV 90.1 08/09/2024   PLT 307 08/09/2024      Latest Ref Rng & Units 08/09/2024    7:02 AM  CMP  Glucose 70 - 99 mg/dL 94   BUN 6 - 20 mg/dL 5   Creatinine 9.55 - 8.99 mg/dL 9.40   Sodium 864 - 854 mmol/L 133   Potassium 3.5 - 5.1 mmol/L 3.6   Chloride 98 - 111 mmol/L 106   CO2 22 - 32 mmol/L 19   Calcium 8.9 - 10.3 mg/dL 8.7   Total Protein 6.5 - 8.1 g/dL 5.7   Total Bilirubin 0.0 - 1.2 mg/dL 0.4   Alkaline Phos 38 - 126 U/L 138   AST 15 - 41 U/L 19   ALT 0 - 44 U/L 12    Edinburgh Score:     No data to display         No data recorded  After visit meds:  Allergies as of 08/09/2024   No Known Allergies   Med Rec must be completed prior to using this [facility]***        Discharge home in stable condition Infant Feeding: {Baby feeding:23562} Infant Disposition:{CHL IP OB HOME WITH FNUYZM:76418} Discharge instruction: per After Visit Summary and Postpartum booklet. Activity: Advance as tolerated. Pelvic rest for 6 weeks.  Diet: {OB ipzu:78888878} Future Appointments:No future appointments. Follow up Visit:  Message sent to [facility] on 08/10/24:  Please schedule this patient for a In person postpartum visit in 6 weeks with the following provider: REACH. Additional Postpartum F/U:2 hour GTT  High risk pregnancy complicated by: OUD in remission Delivery mode:  Vaginal,  Spontaneous Anticipated Birth Control:  PP Depo given   08/09/2024 Olam Boards, CNM

## 2024-08-10 MED ORDER — MEDROXYPROGESTERONE ACETATE 150 MG/ML IM SUSP
150.0000 mg | INTRAMUSCULAR | Status: DC
  Administered 2024-08-10: 150 mg via INTRAMUSCULAR
  Filled 2024-08-10: qty 1

## 2024-08-10 NOTE — Clinical Social Work Maternal (Signed)
 CLINICAL SOCIAL WORK MATERNAL/CHILD NOTE  Patient Details  Name: Jennifer  Yissel Marshall MRN: [id number] Date of Birth: Oct 14, 2001  Date:  08/10/2024  Clinical Social Worker Initiating Note:  Rosina Molt Date/Time: Initiated:  08/10/24/1424     Child's Name:  Jennifer Marshall   Biological Parents:  Mother (Mallorie  Burgner [birth year] 1999)   Need for Interpreter:  None   Reason for Referral:  Current Substance Use/Substance Use During Pregnancy  , Behavioral Health Concerns   Address:  [address] [place] [address]    Phone number:  [phone] (home)     Additional phone number:   Household Members/Support Persons (HM/SP):   Household Member/Support Person 1, Household Member/Support Person 2, Household Member/Support Person 3   HM/SP Name Relationship DOB or Age  HM/SP -1   MOB's Uncle    HM/SP -2   MOB's  Aunt    HM/SP -3 Jennifer Marshall Daughter 25 years old  HM/SP -4        HM/SP -5        HM/SP -6        HM/SP -7        HM/SP -8          Natural Supports (not living in the home):  Spouse/significant other   Professional Supports: None   Employment: Unemployed   Type of Work:     Education:  9 to 11 years   Homebound arranged:    Surveyor, quantity Resources:  Medicaid   Other Resources:  Archivist Considerations Which May Impact Care:    Strengths:  Ability to meet basic needs  , Home prepared for child  , Pediatrician chosen   Psychotropic Medications:         Pediatrician:    [employer] operational [employer] area  Pediatrician List:   [facility]  [employer]    [facility]      Pediatrician Fax Number:    Risk Factors/Current Problems:  Substance Use  , Mental Health Concerns     Cognitive State:  Able to Concentrate  , Alert  , Linear Thinking  , Insightful  , Goal Oriented     Mood/Affect:  Calm  , Comfortable  , Interested  , Relaxed      CSW Assessment: CSW received a consult due to Suboxone  use during pregnancy and Hx of Bipolar.  CSW met MOB at bedside to complete a full psychosocial assessment and offer support. CSW entered the room, introduced herself and acknowledged her family was present. CSW asked MOB for privacy reasons could her family step out for the assessment; MOB was agreeable and her family stepped out. CSW explained her role and her reason for the visit. MOB presented as calm, was agreeable to consult and remained engaged throughout encounter.  CSW collected MOB's demographic information and inquired about her mental health history. MOB reported being diagnosed with Bipolar and ADHD at the age of 23 years old. MOB described her Bipolar as Manic and reported she has not had a episode since the age of 23 years old. MOB reported currently being prescribed Lamictal  and declined therapy resources. MOB reported since being diagnosed with Bipolar at 23 years old, she has been prescribed Lamictal  and finds the medication supportive. CSW provided education regarding the baby blues period vs. perinatal mood disorders, discussed treatment and gave resources for mental health follow up if concerns  arise.  CSW recommends self-evaluation during the postpartum time period using the New Mom Checklist from Postpartum Progress and encouraged MOB to contact a medical professional if symptoms are noted at any time. CSW assessed for safety with MOB SI/HI/DV;MOB denied all.  CSW asked MOB does she receive support resources; MOB said yes( food stamps). MOB reported having all essential items for the infant including a carseat, bassinet and crib for safe sleeping. CSW provided review of Sudden Infant Death Syndrome (SIDS) precautions.  CSW informed MOB due to [facility] and Suboxone  use during her pregnancy; the hospital will perform a UDS and CDS on the infant. If the screenings return with positive results a report to CPS will be made; MOB was  understanding. CSW asked MOB about hx of substances. 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 MOB reported beginning prenatal appointments 25 at the [facility] clinic and began Suboxone  in case of withdrawals. Per chart review MOB tested positive for Benzodiazepines and began Suboxone  support with the [facility] clinic in May 2025. MOB reported she discontinued her use of Suboxone  about 3 months age due to no longer needing the support.  The infant's UDS was collected. CSW will continue to monitor the CDS and complete a CPS report if warranted.  CSW has contacted [facility] CPS and spoke with intake rep Rosina. Per Rosina MOB has not been involved with any CPS cases.  CSW Plan/Description:  No Further Intervention Required/No Barriers to Discharge, Sudden Infant Death Syndrome (SIDS) Education, Perinatal Mood and Anxiety Disorder (PMADs) Education, Hospital Drug Screen Policy Information, Other Information/Referral to [employer], CSW Will Continue to Monitor Umbilical Cord Tissue Drug Screen Results and Make Report if Ranelle Rosina MARLA Joshua, LCSW 08/10/2024, 2:30 PM

## 2024-08-10 NOTE — Progress Notes (Addendum)
 POSTPARTUM PROGRESS NOTE  Post Partum Day 1  Subjective:  Jennifer Marshall is a 23 y.o. H7E7997 s/p SVD at [redacted]w[redacted]d.  She reports she is doing well. No acute events overnight. She denies any problems with ambulating, voiding or po intake. Denies nausea or vomiting.  Pain is well controlled.  Lochia is Normal.  Objective: Blood pressure 113/77, pulse 88, temperature 97.6 F (36.4 C), temperature source Oral, resp. rate 16, height 5' 7 (1.702 m), weight 101.6 kg, last menstrual period 11/04/2023, SpO2 100%, unknown if currently breastfeeding.  BP Readings from Last 3 Encounters:  08/10/24 113/77  08/02/24 116/83  07/28/24 113/70    Physical Exam:  General: alert, cooperative and no distress Chest: no respiratory distress Heart:regular rate, distal pulses intact Uterine Fundus: firm, appropriately tender DVT Evaluation: No calf swelling or tenderness Extremities: none edema Skin: warm, dry  Recent Labs    08/09/24 0702  HGB 9.9*  HCT 29.0*    Assessment/Plan: Jennifer Marshall is a 23 y.o. H7E7997 s/p NSVD at [redacted]w[redacted]d   PPD# 1 - Doing well  Routine postpartum care  Delivery Complications: none  Blood Pressure: normal Anemia/Hb Status: Stable, appropriate postpartum Hb, no intervention indicated Contraception: Inpatient Depo Provera Feeding: bottle feeding Needs Lactation Consultation: No    Dispo: Plan for discharge tomorrow.   LOS: 1 day   Abigail E Mohr, MD Family Medicine Resident 08/10/2024, 7:36 AM  Midwife Attestation:  I personally saw and evaluated the patient, performing the key elements of the service. I developed and verified the management plan that is described in the resident's/student's note, and I agree with the content with my edits above. VSS, HRR&R, Resp unlabored, Legs neg.    Olam Boards, CNM 8:35 AM

## 2024-08-10 NOTE — Anesthesia Postprocedure Evaluation (Signed)
 Anesthesia Post Note  Patient: Jennifer Marshall  Procedure(s) Performed: AN AD HOC LABOR EPIDURAL     Patient location during evaluation: Mother Baby Anesthesia Type: Epidural Level of consciousness: awake and alert Pain management: pain level controlled Vital Signs Assessment: post-procedure vital signs reviewed and stable Respiratory status: spontaneous breathing, nonlabored ventilation and respiratory function stable Cardiovascular status: stable Postop Assessment: no headache, no backache and epidural receding Anesthetic complications: no   No notable events documented.  Last Vitals:  Vitals:   08/10/24 0916 08/10/24 1019  BP: 127/87 123/84  Pulse: 91 99  Resp: 18   Temp: 36.9 C   SpO2: 100% 99%    Last Pain:  Vitals:   08/10/24 1209  TempSrc:   PainSc: 5    Pain Goal:                   [name]

## 2024-08-11 ENCOUNTER — Other Ambulatory Visit (HOSPITAL_COMMUNITY): Payer: Self-pay

## 2024-08-11 MED ORDER — IBUPROFEN 600 MG PO TABS
600.0000 mg | ORAL_TABLET | Freq: Four times a day (QID) | ORAL | 0 refills | Status: AC
  Filled 2024-08-11: qty 30, 8d supply, fill #0

## 2024-08-11 MED ORDER — ACETAMINOPHEN 500 MG PO TABS
1000.0000 mg | ORAL_TABLET | Freq: Four times a day (QID) | ORAL | 0 refills | Status: AC | PRN
  Filled 2024-08-11: qty 60, 8d supply, fill #0

## 2024-08-11 MED ORDER — SENNOSIDES-DOCUSATE SODIUM 8.6-50 MG PO TABS
2.0000 | ORAL_TABLET | Freq: Every day | ORAL | 0 refills | Status: AC
  Filled 2024-08-11: qty 30, 15d supply, fill #0

## 2024-08-12 ENCOUNTER — Encounter: Admitting: Family Medicine

## 2024-08-12 NOTE — BH Specialist Note (Signed)
 Pt did not arrive to video visit and did not answer the phone ; Unable to leave voicemail; left MyChart message for patient.

## 2024-08-13 LAB — MISC LABCORP TEST (SEND OUT): Labcorp test code: 912334

## 2024-08-16 ENCOUNTER — Encounter: Admitting: Family Medicine

## 2024-08-18 ENCOUNTER — Ambulatory Visit: Payer: Self-pay | Admitting: Clinical

## 2024-08-18 DIAGNOSIS — Z91199 Patient's noncompliance with other medical treatment and regimen due to unspecified reason: Secondary | ICD-10-CM

## 2024-08-19 ENCOUNTER — Encounter: Admitting: Family Medicine

## 2024-08-23 ENCOUNTER — Ambulatory Visit: Admitting: Family Medicine

## 2024-08-24 ENCOUNTER — Telehealth (HOSPITAL_COMMUNITY): Payer: Self-pay | Admitting: *Deleted

## 2024-08-24 NOTE — Telephone Encounter (Signed)
 08/24/2024  Name: Jennifer Marshall MRN: [id number] DOB: 2001/01/10  Reason for Call:  Transition of Care Hospital Discharge Call  Contact Status: Patient Contact Status: Unable to contact (Call cannot be completed at this time message; unable to leave message for patient.)  Language assistant needed:          Follow-Up Questions:    Van Postnatal Depression Scale:  In the Past 7 Days:    PHQ2-9 Depression Scale:     Discharge Follow-up:    Post-discharge interventions: NA  Delshawn Stech,RN  08/24/2024 1641

## 2024-09-20 ENCOUNTER — Ambulatory Visit: Admitting: Family Medicine
